# Patient Record
Sex: Female | Born: 1940 | Race: Black or African American | Hispanic: No | Smoking: Former smoker
Health system: Southern US, Community
[De-identification: ages and names within clinical notes are randomized; demographics above are authoritative.]

## PROBLEM LIST (undated history)

## (undated) DIAGNOSIS — R718 Other abnormality of red blood cells: Secondary | ICD-10-CM

## (undated) DIAGNOSIS — Z923 Personal history of irradiation: Secondary | ICD-10-CM

## (undated) DIAGNOSIS — J449 Chronic obstructive pulmonary disease, unspecified: Secondary | ICD-10-CM

## (undated) DIAGNOSIS — Z9221 Personal history of antineoplastic chemotherapy: Secondary | ICD-10-CM

## (undated) DIAGNOSIS — C801 Malignant (primary) neoplasm, unspecified: Secondary | ICD-10-CM

## (undated) DIAGNOSIS — E785 Hyperlipidemia, unspecified: Secondary | ICD-10-CM

## (undated) DIAGNOSIS — R634 Abnormal weight loss: Secondary | ICD-10-CM

## (undated) DIAGNOSIS — E039 Hypothyroidism, unspecified: Secondary | ICD-10-CM

## (undated) DIAGNOSIS — C12 Malignant neoplasm of pyriform sinus: Secondary | ICD-10-CM

## (undated) DIAGNOSIS — IMO0002 Reserved for concepts with insufficient information to code with codable children: Secondary | ICD-10-CM

## (undated) HISTORY — DX: Chronic obstructive pulmonary disease, unspecified: J44.9

## (undated) HISTORY — DX: Hyperlipidemia, unspecified: E78.5

## (undated) HISTORY — DX: Other abnormality of red blood cells: R71.8

## (undated) HISTORY — PX: HIATAL HERNIA REPAIR: SHX195

## (undated) HISTORY — DX: Hypothyroidism, unspecified: E03.9

## (undated) HISTORY — DX: Personal history of irradiation: Z92.3

## (undated) HISTORY — PX: OTHER SURGICAL HISTORY: SHX169

## (undated) HISTORY — DX: Personal history of antineoplastic chemotherapy: Z92.21

## (undated) HISTORY — DX: Malignant neoplasm of pyriform sinus: C12

## (undated) HISTORY — DX: Abnormal weight loss: R63.4

---

## 2001-10-14 ENCOUNTER — Encounter: Payer: Self-pay | Admitting: Internal Medicine

## 2001-10-14 ENCOUNTER — Ambulatory Visit (HOSPITAL_COMMUNITY): Admission: RE | Admit: 2001-10-14 | Discharge: 2001-10-14 | Payer: Self-pay | Admitting: Internal Medicine

## 2003-12-03 ENCOUNTER — Encounter: Admission: RE | Admit: 2003-12-03 | Discharge: 2003-12-03 | Payer: Self-pay | Admitting: Internal Medicine

## 2004-12-28 ENCOUNTER — Encounter: Admission: RE | Admit: 2004-12-28 | Discharge: 2004-12-28 | Payer: Self-pay | Admitting: Internal Medicine

## 2006-04-21 ENCOUNTER — Emergency Department (HOSPITAL_COMMUNITY): Admission: EM | Admit: 2006-04-21 | Discharge: 2006-04-21 | Payer: Self-pay | Admitting: Emergency Medicine

## 2006-12-11 ENCOUNTER — Encounter: Admission: RE | Admit: 2006-12-11 | Discharge: 2006-12-11 | Payer: Self-pay | Admitting: Internal Medicine

## 2008-07-05 ENCOUNTER — Encounter: Admission: RE | Admit: 2008-07-05 | Discharge: 2008-07-05 | Payer: Self-pay | Admitting: Otolaryngology

## 2008-07-07 ENCOUNTER — Ambulatory Visit (HOSPITAL_COMMUNITY): Admission: RE | Admit: 2008-07-07 | Discharge: 2008-07-08 | Payer: Self-pay | Admitting: Otolaryngology

## 2008-07-07 ENCOUNTER — Encounter (INDEPENDENT_AMBULATORY_CARE_PROVIDER_SITE_OTHER): Payer: Self-pay | Admitting: Otolaryngology

## 2008-07-07 HISTORY — PX: OTHER SURGICAL HISTORY: SHX169

## 2008-07-07 HISTORY — PX: DIRECT LARYNGOSCOPY: SHX5326

## 2008-07-15 ENCOUNTER — Ambulatory Visit (HOSPITAL_COMMUNITY): Admission: RE | Admit: 2008-07-15 | Discharge: 2008-07-15 | Payer: Self-pay | Admitting: Otolaryngology

## 2008-07-26 ENCOUNTER — Encounter: Admission: RE | Admit: 2008-07-26 | Discharge: 2008-07-26 | Payer: Self-pay | Admitting: Otolaryngology

## 2008-08-03 ENCOUNTER — Ambulatory Visit: Admission: RE | Admit: 2008-08-03 | Discharge: 2008-08-27 | Payer: Self-pay | Admitting: Radiation Oncology

## 2008-08-09 ENCOUNTER — Ambulatory Visit (HOSPITAL_COMMUNITY): Admission: RE | Admit: 2008-08-09 | Discharge: 2008-08-09 | Payer: Self-pay | Admitting: Radiation Oncology

## 2008-09-27 ENCOUNTER — Ambulatory Visit (HOSPITAL_COMMUNITY): Admission: RE | Admit: 2008-09-27 | Discharge: 2008-09-27 | Payer: Self-pay | Admitting: Surgery

## 2008-09-27 ENCOUNTER — Encounter (INDEPENDENT_AMBULATORY_CARE_PROVIDER_SITE_OTHER): Payer: Self-pay | Admitting: Surgery

## 2008-10-05 ENCOUNTER — Ambulatory Visit: Admission: RE | Admit: 2008-10-05 | Discharge: 2008-12-22 | Payer: Self-pay | Admitting: Radiation Oncology

## 2008-10-05 DIAGNOSIS — C12 Malignant neoplasm of pyriform sinus: Secondary | ICD-10-CM

## 2008-10-05 HISTORY — DX: Malignant neoplasm of pyriform sinus: C12

## 2008-10-08 ENCOUNTER — Ambulatory Visit: Payer: Self-pay | Admitting: Oncology

## 2008-10-13 ENCOUNTER — Ambulatory Visit: Payer: Self-pay | Admitting: Dentistry

## 2008-10-13 ENCOUNTER — Encounter: Admission: RE | Admit: 2008-10-13 | Discharge: 2008-10-13 | Payer: Self-pay | Admitting: Dentistry

## 2008-10-14 LAB — CBC WITH DIFFERENTIAL/PLATELET
BASO%: 0.6 % (ref 0.0–2.0)
EOS%: 0.8 % (ref 0.0–7.0)
MCH: 29.8 pg (ref 25.1–34.0)
MCHC: 34.6 g/dL (ref 31.5–36.0)
MONO#: 0.5 10*3/uL (ref 0.1–0.9)
NEUT%: 48.6 % (ref 38.4–76.8)
RBC: 4.35 10*6/uL (ref 3.70–5.45)
RDW: 13.6 % (ref 11.2–14.5)
WBC: 6.2 10*3/uL (ref 3.9–10.3)
lymph#: 2.6 10*3/uL (ref 0.9–3.3)

## 2008-10-14 LAB — PROTHROMBIN TIME: Prothrombin Time: 13.7 seconds (ref 11.6–15.2)

## 2008-10-14 LAB — COMPREHENSIVE METABOLIC PANEL
ALT: <8 U/L (ref 0–35)
AST: 13 U/L (ref 0–37)
CO2: 25 mEq/L (ref 19–32)
Calcium: 9.1 mg/dL (ref 8.4–10.5)
Chloride: 108 mEq/L (ref 96–112)
Creatinine, Ser: 0.94 mg/dL (ref 0.40–1.20)
Sodium: 145 mEq/L (ref 135–145)
Total Protein: 6.8 g/dL (ref 6.0–8.3)

## 2008-10-15 ENCOUNTER — Ambulatory Visit (HOSPITAL_COMMUNITY): Admission: RE | Admit: 2008-10-15 | Discharge: 2008-10-15 | Payer: Self-pay | Admitting: Dentistry

## 2008-10-15 ENCOUNTER — Ambulatory Visit: Payer: Self-pay | Admitting: Dentistry

## 2008-10-26 ENCOUNTER — Encounter: Payer: Self-pay | Admitting: Oncology

## 2008-10-27 ENCOUNTER — Ambulatory Visit (HOSPITAL_COMMUNITY): Admission: RE | Admit: 2008-10-27 | Discharge: 2008-10-27 | Payer: Self-pay | Admitting: Oncology

## 2008-11-18 ENCOUNTER — Ambulatory Visit: Payer: Self-pay | Admitting: Oncology

## 2008-11-22 LAB — COMPREHENSIVE METABOLIC PANEL WITH GFR
ALT: 17 U/L (ref 0–35)
AST: 31 U/L (ref 0–37)
Albumin: 3.8 g/dL (ref 3.5–5.2)
Alkaline Phosphatase: 58 U/L (ref 39–117)
BUN: 9 mg/dL (ref 6–23)
CO2: 27 meq/L (ref 19–32)
Calcium: 9.6 mg/dL (ref 8.4–10.5)
Chloride: 107 meq/L (ref 96–112)
Creatinine, Ser: 0.68 mg/dL (ref 0.40–1.20)
Glucose, Bld: 89 mg/dL (ref 70–99)
Potassium: 4.7 meq/L (ref 3.5–5.3)
Sodium: 142 meq/L (ref 135–145)
Total Bilirubin: 0.8 mg/dL (ref 0.3–1.2)
Total Protein: 7.8 g/dL (ref 6.0–8.3)

## 2008-11-22 LAB — CBC WITH DIFFERENTIAL/PLATELET
BASO%: 0.7 % (ref 0.0–2.0)
Basophils Absolute: 0 10*3/uL (ref 0.0–0.1)
EOS%: 2.3 % (ref 0.0–7.0)
Eosinophils Absolute: 0.1 10*3/uL (ref 0.0–0.5)
HCT: 36.6 % (ref 34.8–46.6)
HGB: 12.4 g/dL (ref 11.6–15.9)
LYMPH%: 40.8 % (ref 14.0–49.7)
MCH: 27.9 pg (ref 25.1–34.0)
MCHC: 33.9 g/dL (ref 31.5–36.0)
MCV: 82.2 fL (ref 79.5–101.0)
MONO#: 0.6 10*3/uL (ref 0.1–0.9)
MONO%: 10.7 % (ref 0.0–14.0)
NEUT#: 2.6 10*3/uL (ref 1.5–6.5)
NEUT%: 45.5 % (ref 38.4–76.8)
Platelets: 211 10*3/uL (ref 145–400)
RBC: 4.45 10*6/uL (ref 3.70–5.45)
RDW: 13.4 % (ref 11.2–14.5)
WBC: 5.7 10*3/uL (ref 3.9–10.3)
lymph#: 2.3 10*3/uL (ref 0.9–3.3)
nRBC: 0 % (ref 0–0)

## 2008-12-06 LAB — CBC WITH DIFFERENTIAL/PLATELET
Basophils Absolute: 0 10*3/uL (ref 0.0–0.1)
Eosinophils Absolute: 0.1 10*3/uL (ref 0.0–0.5)
HCT: 35.4 % (ref 34.8–46.6)
HGB: 11.9 g/dL (ref 11.6–15.9)
LYMPH%: 34 % (ref 14.0–49.7)
MCV: 84.2 fL (ref 79.5–101.0)
MONO%: 15.5 % — ABNORMAL HIGH (ref 0.0–14.0)
NEUT#: 1.6 10*3/uL (ref 1.5–6.5)
Platelets: 203 10*3/uL (ref 145–400)
RDW: 13.5 % (ref 11.2–14.5)

## 2008-12-06 LAB — BASIC METABOLIC PANEL
BUN: 7 mg/dL (ref 6–23)
Glucose, Bld: 106 mg/dL — ABNORMAL HIGH (ref 70–99)
Potassium: 3 mEq/L — ABNORMAL LOW (ref 3.5–5.3)

## 2008-12-13 LAB — CBC WITH DIFFERENTIAL/PLATELET
BASO%: 0.9 % (ref 0.0–2.0)
EOS%: 1.9 % (ref 0.0–7.0)
LYMPH%: 31 % (ref 14.0–49.7)
MCHC: 33.8 g/dL (ref 31.5–36.0)
MCV: 81.6 fL (ref 79.5–101.0)
MONO#: 0.7 10*3/uL (ref 0.1–0.9)
MONO%: 30.5 % — ABNORMAL HIGH (ref 0.0–14.0)
Platelets: 241 10*3/uL (ref 145–400)
RBC: 4.13 10*6/uL (ref 3.70–5.45)
WBC: 2.1 10*3/uL — ABNORMAL LOW (ref 3.9–10.3)

## 2008-12-15 ENCOUNTER — Ambulatory Visit: Payer: Self-pay | Admitting: Dentistry

## 2008-12-16 ENCOUNTER — Ambulatory Visit: Payer: Self-pay | Admitting: Oncology

## 2008-12-20 LAB — COMPREHENSIVE METABOLIC PANEL
ALT: 13 U/L (ref 0–35)
Albumin: 3.8 g/dL (ref 3.5–5.2)
CO2: 29 mEq/L (ref 19–32)
Potassium: 3.9 mEq/L (ref 3.5–5.3)
Sodium: 138 mEq/L (ref 135–145)
Total Bilirubin: 0.5 mg/dL (ref 0.3–1.2)
Total Protein: 7.8 g/dL (ref 6.0–8.3)

## 2008-12-20 LAB — CBC WITH DIFFERENTIAL/PLATELET
BASO%: 0.3 % (ref 0.0–2.0)
MCHC: 34.3 g/dL (ref 31.5–36.0)
MONO#: 0.6 10*3/uL (ref 0.1–0.9)
RBC: 4.49 10*6/uL (ref 3.70–5.45)
RDW: 14.6 % — ABNORMAL HIGH (ref 11.2–14.5)
WBC: 3.2 10*3/uL — ABNORMAL LOW (ref 3.9–10.3)
lymph#: 0.6 10*3/uL — ABNORMAL LOW (ref 0.9–3.3)

## 2008-12-20 LAB — MAGNESIUM: Magnesium: 1.9 mg/dL (ref 1.5–2.5)

## 2008-12-22 ENCOUNTER — Ambulatory Visit: Admission: RE | Admit: 2008-12-22 | Discharge: 2009-03-22 | Payer: Self-pay | Admitting: Radiation Oncology

## 2008-12-29 LAB — CBC WITH DIFFERENTIAL/PLATELET
BASO%: 0.4 % (ref 0.0–2.0)
LYMPH%: 13.1 % — ABNORMAL LOW (ref 14.0–49.7)
MCHC: 33.9 g/dL (ref 31.5–36.0)
MCV: 83.8 fL (ref 79.5–101.0)
MONO#: 0.4 10*3/uL (ref 0.1–0.9)
MONO%: 16.4 % — ABNORMAL HIGH (ref 0.0–14.0)
Platelets: 90 10*3/uL — ABNORMAL LOW (ref 145–400)
RBC: 4.71 10*6/uL (ref 3.70–5.45)
RDW: 14.6 % — ABNORMAL HIGH (ref 11.2–14.5)
WBC: 2.6 10*3/uL — ABNORMAL LOW (ref 3.9–10.3)

## 2008-12-29 LAB — BASIC METABOLIC PANEL
Calcium: 8.6 mg/dL (ref 8.4–10.5)
Sodium: 133 mEq/L — ABNORMAL LOW (ref 135–145)

## 2008-12-31 LAB — COMPREHENSIVE METABOLIC PANEL
ALT: 16 U/L (ref 0–35)
Albumin: 3.3 g/dL — ABNORMAL LOW (ref 3.5–5.2)
CO2: 31 mEq/L (ref 19–32)
Calcium: 8.1 mg/dL — ABNORMAL LOW (ref 8.4–10.5)
Chloride: 95 mEq/L — ABNORMAL LOW (ref 96–112)
Potassium: 2.2 mEq/L — CL (ref 3.5–5.3)
Sodium: 135 mEq/L (ref 135–145)
Total Protein: 6.8 g/dL (ref 6.0–8.3)

## 2008-12-31 LAB — MAGNESIUM: Magnesium: 1.2 mg/dL — ABNORMAL LOW (ref 1.5–2.5)

## 2009-01-03 LAB — BASIC METABOLIC PANEL WITH GFR
BUN: 8 mg/dL (ref 6–23)
CO2: 30 meq/L (ref 19–32)
Calcium: 8.6 mg/dL (ref 8.4–10.5)
Chloride: 97 meq/L (ref 96–112)
Creatinine, Ser: 0.85 mg/dL (ref 0.40–1.20)
Glucose, Bld: 101 mg/dL — ABNORMAL HIGH (ref 70–99)
Potassium: 2.7 meq/L — CL (ref 3.5–5.3)
Sodium: 134 meq/L — ABNORMAL LOW (ref 135–145)

## 2009-01-03 LAB — MAGNESIUM: Magnesium: 1.5 mg/dL (ref 1.5–2.5)

## 2009-01-06 ENCOUNTER — Ambulatory Visit: Payer: Self-pay | Admitting: Oncology

## 2009-01-06 LAB — COMPREHENSIVE METABOLIC PANEL
ALT: 15 U/L (ref 0–35)
BUN: 14 mg/dL (ref 6–23)
CO2: 29 mEq/L (ref 19–32)
Creatinine, Ser: 0.91 mg/dL (ref 0.40–1.20)
Total Bilirubin: 0.1 mg/dL — ABNORMAL LOW (ref 0.3–1.2)

## 2009-01-06 LAB — CBC WITH DIFFERENTIAL/PLATELET
BASO%: 0.2 % (ref 0.0–2.0)
Basophils Absolute: 0 10*3/uL (ref 0.0–0.1)
EOS%: 0.5 % (ref 0.0–7.0)
HCT: 32.2 % — ABNORMAL LOW (ref 34.8–46.6)
LYMPH%: 10.6 % — ABNORMAL LOW (ref 14.0–49.7)
MCH: 27.8 pg (ref 25.1–34.0)
MCHC: 34.2 g/dL (ref 31.5–36.0)
MONO#: 1.1 10*3/uL — ABNORMAL HIGH (ref 0.1–0.9)
NEUT%: 64.4 % (ref 38.4–76.8)
Platelets: 155 10*3/uL (ref 145–400)

## 2009-01-06 LAB — MAGNESIUM: Magnesium: 1.6 mg/dL (ref 1.5–2.5)

## 2009-01-12 LAB — CBC WITH DIFFERENTIAL/PLATELET
Basophils Absolute: 0 10*3/uL (ref 0.0–0.1)
Eosinophils Absolute: 0 10*3/uL (ref 0.0–0.5)
HCT: 34.1 % — ABNORMAL LOW (ref 34.8–46.6)
HGB: 11.9 g/dL (ref 11.6–15.9)
MCH: 28.6 pg (ref 25.1–34.0)
MONO#: 0.9 10*3/uL (ref 0.1–0.9)
NEUT#: 3 10*3/uL (ref 1.5–6.5)
NEUT%: 65.8 % (ref 38.4–76.8)
RDW: 16.2 % — ABNORMAL HIGH (ref 11.2–14.5)
WBC: 4.6 10*3/uL (ref 3.9–10.3)
lymph#: 0.6 10*3/uL — ABNORMAL LOW (ref 0.9–3.3)

## 2009-01-12 LAB — COMPREHENSIVE METABOLIC PANEL
Albumin: 4 g/dL (ref 3.5–5.2)
BUN: 18 mg/dL (ref 6–23)
CO2: 26 mEq/L (ref 19–32)
Calcium: 9.6 mg/dL (ref 8.4–10.5)
Chloride: 95 mEq/L — ABNORMAL LOW (ref 96–112)
Creatinine, Ser: 0.94 mg/dL (ref 0.40–1.20)
Glucose, Bld: 140 mg/dL — ABNORMAL HIGH (ref 70–99)
Potassium: 4.9 mEq/L (ref 3.5–5.3)

## 2009-01-12 LAB — MAGNESIUM: Magnesium: 1.9 mg/dL (ref 1.5–2.5)

## 2009-01-27 LAB — CBC WITH DIFFERENTIAL/PLATELET
Basophils Absolute: 0 10*3/uL (ref 0.0–0.1)
EOS%: 0.8 % (ref 0.0–7.0)
Eosinophils Absolute: 0 10*3/uL (ref 0.0–0.5)
HGB: 11.7 g/dL (ref 11.6–15.9)
MCV: 88 fL (ref 79.5–101.0)
MONO%: 19.9 % — ABNORMAL HIGH (ref 0.0–14.0)
NEUT#: 4.2 10*3/uL (ref 1.5–6.5)
RBC: 3.85 10*6/uL (ref 3.70–5.45)
RDW: 19.7 % — ABNORMAL HIGH (ref 11.2–14.5)
lymph#: 0.5 10*3/uL — ABNORMAL LOW (ref 0.9–3.3)

## 2009-01-27 LAB — COMPREHENSIVE METABOLIC PANEL
AST: 18 U/L (ref 0–37)
Albumin: 4 g/dL (ref 3.5–5.2)
Alkaline Phosphatase: 58 U/L (ref 39–117)
BUN: 23 mg/dL (ref 6–23)
Calcium: 9.9 mg/dL (ref 8.4–10.5)
Chloride: 97 mEq/L (ref 96–112)
Glucose, Bld: 116 mg/dL — ABNORMAL HIGH (ref 70–99)
Potassium: 5.3 mEq/L (ref 3.5–5.3)
Sodium: 135 mEq/L (ref 135–145)
Total Protein: 7.5 g/dL (ref 6.0–8.3)

## 2009-02-16 ENCOUNTER — Ambulatory Visit: Payer: Self-pay | Admitting: Oncology

## 2009-02-18 ENCOUNTER — Ambulatory Visit (HOSPITAL_COMMUNITY): Admission: RE | Admit: 2009-02-18 | Discharge: 2009-02-18 | Payer: Self-pay | Admitting: Internal Medicine

## 2009-02-18 ENCOUNTER — Encounter: Payer: Self-pay | Admitting: Oncology

## 2009-02-21 LAB — CBC WITH DIFFERENTIAL/PLATELET
BASO%: 0.1 % (ref 0.0–2.0)
EOS%: 0.9 % (ref 0.0–7.0)
HCT: 33.2 % — ABNORMAL LOW (ref 34.8–46.6)
LYMPH%: 9.2 % — ABNORMAL LOW (ref 14.0–49.7)
MCH: 31.6 pg (ref 25.1–34.0)
MCHC: 34.2 g/dL (ref 31.5–36.0)
MONO%: 12.5 % (ref 0.0–14.0)
NEUT%: 77.3 % — ABNORMAL HIGH (ref 38.4–76.8)
Platelets: 252 10*3/uL (ref 145–400)

## 2009-02-21 LAB — COMPREHENSIVE METABOLIC PANEL
ALT: 10 U/L (ref 0–35)
AST: 15 U/L (ref 0–37)
Creatinine, Ser: 0.89 mg/dL (ref 0.40–1.20)
Total Bilirubin: 0.2 mg/dL — ABNORMAL LOW (ref 0.3–1.2)

## 2009-03-09 ENCOUNTER — Ambulatory Visit: Payer: Self-pay | Admitting: Dentistry

## 2009-04-20 ENCOUNTER — Ambulatory Visit (HOSPITAL_COMMUNITY): Admission: RE | Admit: 2009-04-20 | Discharge: 2009-04-20 | Payer: Self-pay | Admitting: Oncology

## 2009-04-20 ENCOUNTER — Ambulatory Visit: Payer: Self-pay | Admitting: Oncology

## 2009-04-22 LAB — COMPREHENSIVE METABOLIC PANEL
AST: 27 U/L (ref 0–37)
Albumin: 3.8 g/dL (ref 3.5–5.2)
Alkaline Phosphatase: 46 U/L (ref 39–117)
Glucose, Bld: 103 mg/dL — ABNORMAL HIGH (ref 70–99)
Potassium: 3.9 mEq/L (ref 3.5–5.3)
Sodium: 135 mEq/L (ref 135–145)
Total Protein: 7.6 g/dL (ref 6.0–8.3)

## 2009-04-22 LAB — CBC WITH DIFFERENTIAL/PLATELET
Eosinophils Absolute: 0.1 10*3/uL (ref 0.0–0.5)
MCV: 91.3 fL (ref 79.5–101.0)
MONO#: 0.6 10*3/uL (ref 0.1–0.9)
MONO%: 14.9 % — ABNORMAL HIGH (ref 0.0–14.0)
NEUT#: 2.4 10*3/uL (ref 1.5–6.5)
RBC: 4.42 10*6/uL (ref 3.70–5.45)
RDW: 13.2 % (ref 11.2–14.5)
WBC: 3.8 10*3/uL — ABNORMAL LOW (ref 3.9–10.3)

## 2009-04-27 LAB — T4, FREE: Free T4: 0.83 ng/dL (ref 0.80–1.80)

## 2009-05-11 ENCOUNTER — Ambulatory Visit: Payer: Self-pay | Admitting: Dentistry

## 2009-06-14 ENCOUNTER — Encounter: Admission: RE | Admit: 2009-06-14 | Discharge: 2009-09-12 | Payer: Self-pay | Admitting: Radiation Oncology

## 2009-07-05 ENCOUNTER — Ambulatory Visit: Payer: Self-pay | Admitting: Dentistry

## 2009-07-06 ENCOUNTER — Ambulatory Visit (HOSPITAL_COMMUNITY): Admission: RE | Admit: 2009-07-06 | Discharge: 2009-07-06 | Payer: Self-pay | Admitting: Oncology

## 2009-07-07 ENCOUNTER — Ambulatory Visit (HOSPITAL_COMMUNITY): Admission: RE | Admit: 2009-07-07 | Discharge: 2009-07-07 | Payer: Self-pay | Admitting: Oncology

## 2009-07-18 ENCOUNTER — Ambulatory Visit: Payer: Self-pay | Admitting: Oncology

## 2009-07-21 LAB — COMPREHENSIVE METABOLIC PANEL
ALT: 14 U/L (ref 0–35)
AST: 20 U/L (ref 0–37)
Alkaline Phosphatase: 45 U/L (ref 39–117)
Total Bilirubin: 0.4 mg/dL (ref 0.3–1.2)
Total Protein: 7.2 g/dL (ref 6.0–8.3)

## 2009-07-21 LAB — CBC WITH DIFFERENTIAL/PLATELET
BASO%: 0.3 % (ref 0.0–2.0)
Basophils Absolute: 0 10*3/uL (ref 0.0–0.1)
Eosinophils Absolute: 0 10*3/uL (ref 0.0–0.5)
HCT: 38.7 % (ref 34.8–46.6)
MCH: 29.9 pg (ref 25.1–34.0)
MONO#: 0.6 10*3/uL (ref 0.1–0.9)
NEUT%: 57.1 % (ref 38.4–76.8)
Platelets: 240 10*3/uL (ref 145–400)
RDW: 13.5 % (ref 11.2–14.5)
WBC: 3.9 10*3/uL (ref 3.9–10.3)
lymph#: 1 10*3/uL (ref 0.9–3.3)

## 2009-07-30 ENCOUNTER — Emergency Department (HOSPITAL_COMMUNITY): Admission: EM | Admit: 2009-07-30 | Discharge: 2009-07-31 | Payer: Self-pay | Admitting: Emergency Medicine

## 2009-08-23 ENCOUNTER — Encounter: Admission: RE | Admit: 2009-08-23 | Discharge: 2009-08-23 | Payer: Self-pay | Admitting: Internal Medicine

## 2009-08-24 ENCOUNTER — Ambulatory Visit: Payer: Self-pay | Admitting: Dentistry

## 2009-10-12 ENCOUNTER — Ambulatory Visit: Payer: Self-pay | Admitting: Oncology

## 2009-10-14 LAB — COMPREHENSIVE METABOLIC PANEL
ALT: 8 U/L (ref 0–35)
Albumin: 3.8 g/dL (ref 3.5–5.2)
Alkaline Phosphatase: 41 U/L (ref 39–117)
Calcium: 8.5 mg/dL (ref 8.4–10.5)
Chloride: 106 mEq/L (ref 96–112)
Glucose, Bld: 88 mg/dL (ref 70–99)
Potassium: 2.9 mEq/L — ABNORMAL LOW (ref 3.5–5.3)
Total Protein: 6.6 g/dL (ref 6.0–8.3)

## 2009-10-14 LAB — CBC WITH DIFFERENTIAL/PLATELET
Basophils Absolute: 0 10*3/uL (ref 0.0–0.1)
EOS%: 2.7 % (ref 0.0–7.0)
HGB: 11.6 g/dL (ref 11.6–15.9)
LYMPH%: 30.8 % (ref 14.0–49.7)
MCH: 30.5 pg (ref 25.1–34.0)
MCHC: 34.2 g/dL (ref 31.5–36.0)
MONO#: 0.6 10*3/uL (ref 0.1–0.9)
MONO%: 14.4 % — ABNORMAL HIGH (ref 0.0–14.0)
NEUT%: 51.9 % (ref 38.4–76.8)
RDW: 13.7 % (ref 11.2–14.5)
WBC: 3.9 10*3/uL (ref 3.9–10.3)

## 2009-10-21 IMAGING — XA IR PERC PLACEMENT GASTROSTOMY
1 series · 10 of 10 positions shown · non-contrast
Comparison: none

CLINICAL HISTORY: 68-year-old with neck cancer.

[Series 1: run · 10 of 10 slices shown]
[im 1/10]
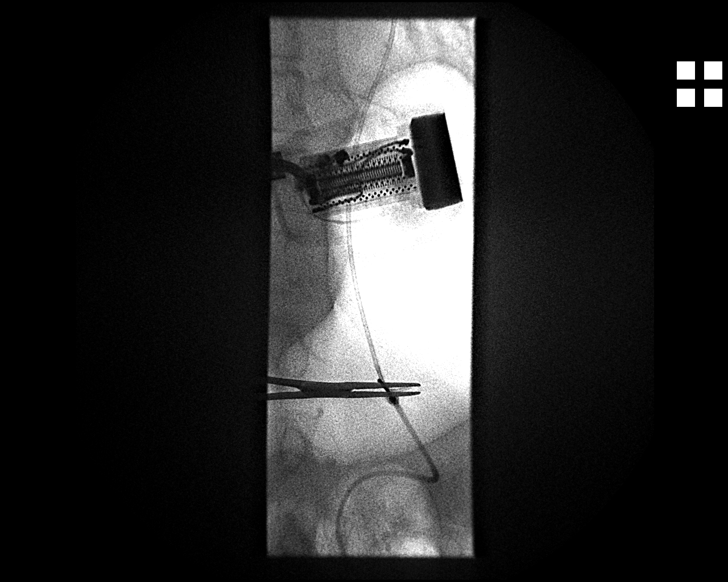
[im 2/10]
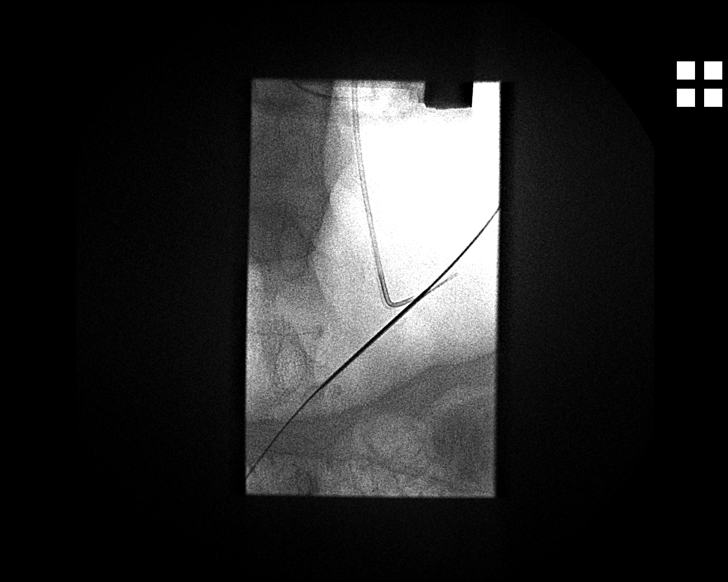
[im 3/10]
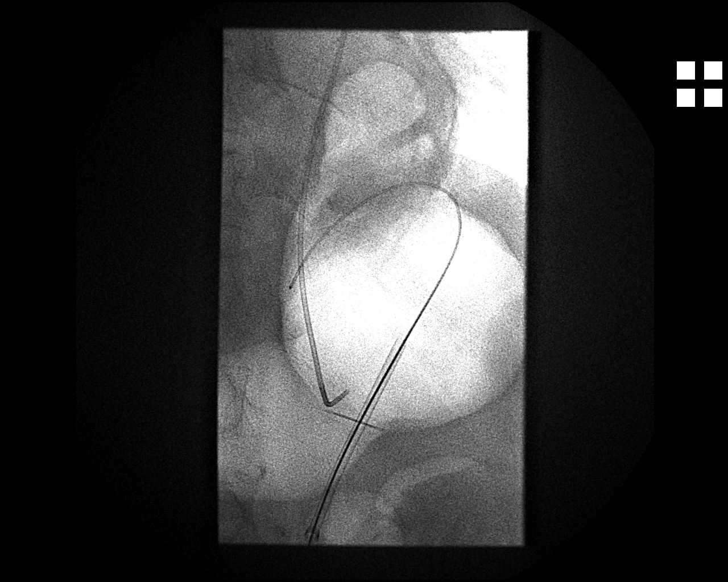
[im 4/10]
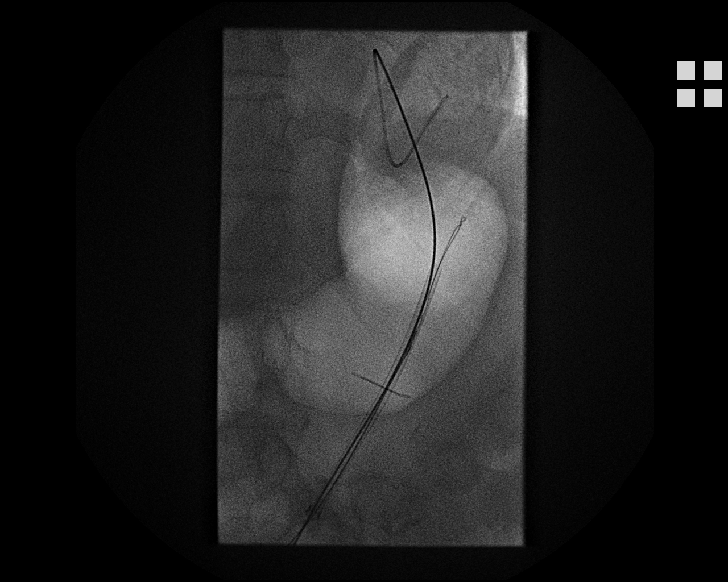
[im 5/10]
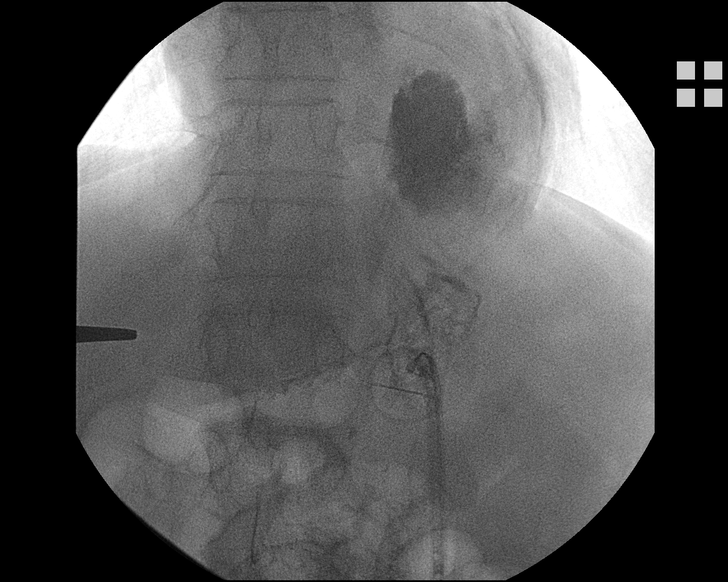
[im 6/10]
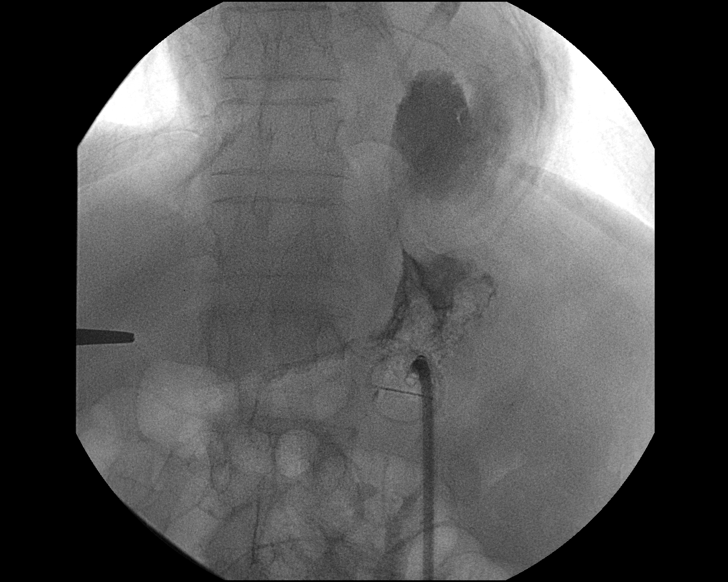
[im 7/10]
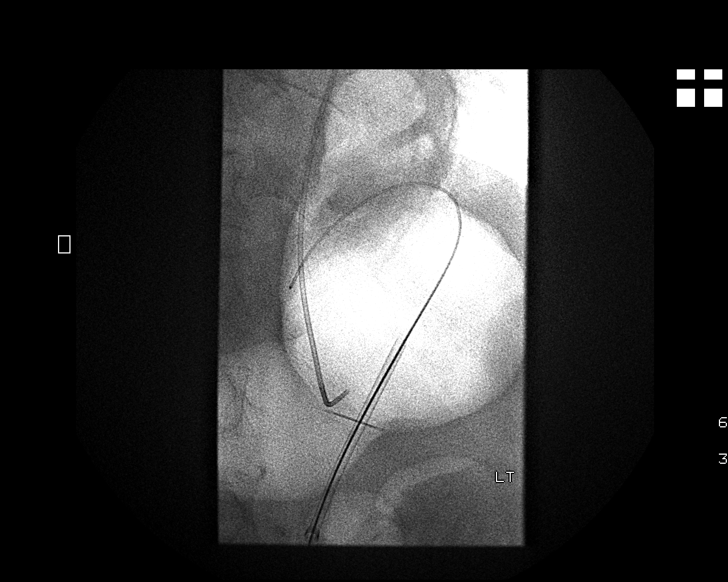
[im 8/10]
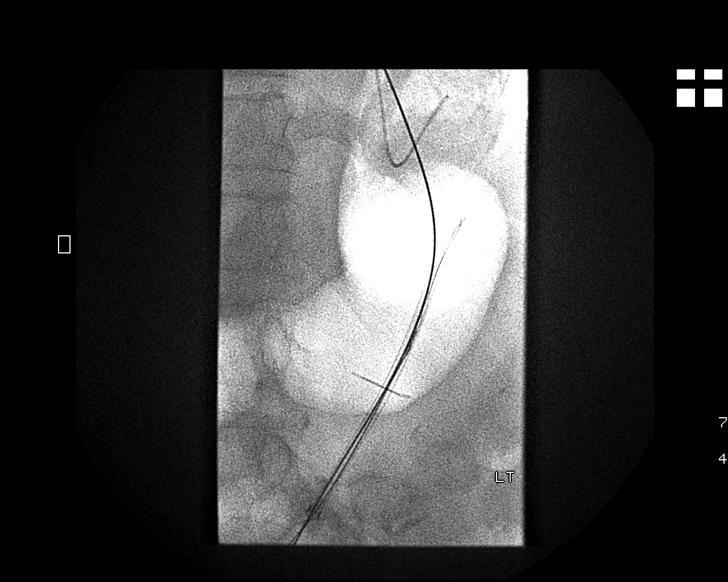
[im 9/10]
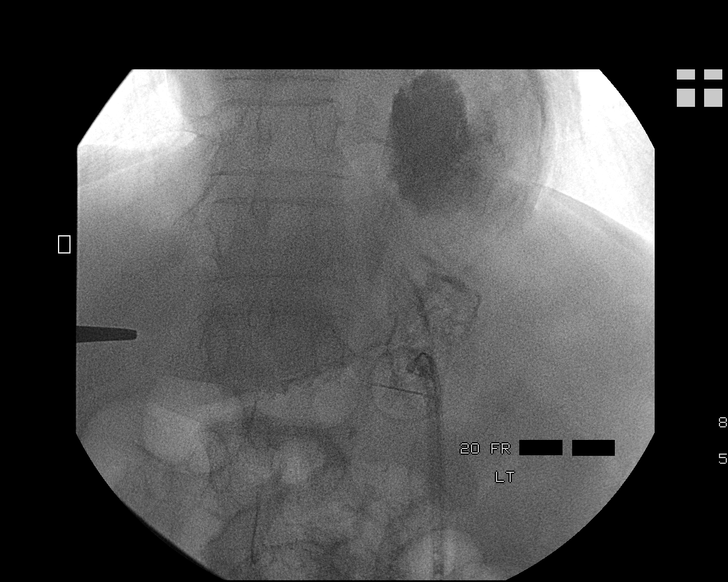
[im 10/10]
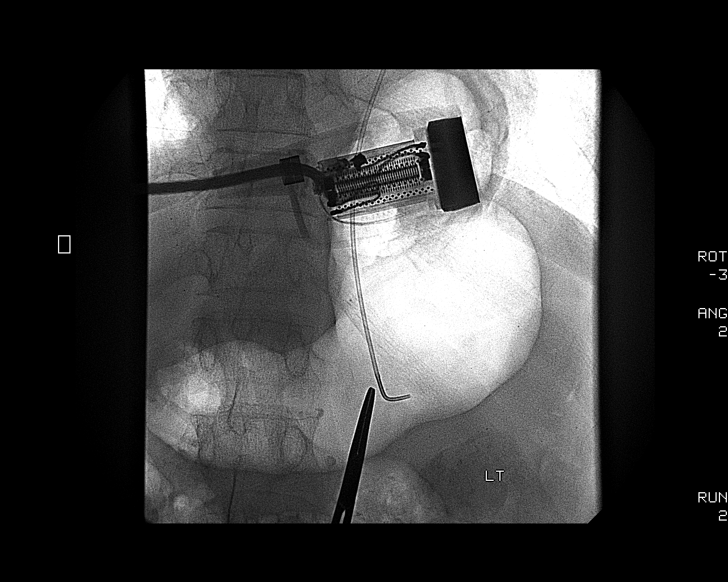

[10 of 10 positions shown; findings below may reference images not displayed]

PROCEDURE(S): PERCUTANEOUS GASTROSTOMY TUBE WITH FLUOROSCOPIC
GUIDANCE

Medications:Versed 1.5mg, Fentanyl 75 mcg vancomycin 1 gram

Sedation time:30 minutes

Fluoroscopy time: 9.3 minutes

Contrast:  25 ml [id number]

Procedure:Informed consent was obtained for a percutaneous
gastrostomy tube.  The patient was placed on the interventional
table.  Fluoroscopy demonstrated oral contrast in the transverse
colon.  An orogastric tube was placed with fluoroscopic guidance.
The anterior abdomen was prepped and draped in sterile fashion.
Stomach was inflated with air through the orogastric tube.  The
left hepatic lobe was identified with ultrasound.  The skin and
subcutaneous tissues were anesthetized with 1% lidocaine.  A 17
gauge needle was directed into the distended stomach with
fluoroscopic guidance.  A wire was advanced into the stomach and Aniwaa
Chou was deployed.  A 9-French vascular sheath was placed and the
orogastric tube was snared using a Gooseneck snare device.  The
orogastric tube and snare were pulled out of the patient's mouth.
The snare device was connected to a 20-French gastrostomy tube.
The snare device and gastrostomy tube were pulled through the
patient's mouth and out the anterior abdominal wall.  The
gastrostomy tube was cut to an appropriate length.  Contrast
injection through gastrostomy tube confirmed placement within the
stomach.  Fluoroscopic images were obtained for documentation.  The
gastrostomy tube was flushed with normal saline.
FINDINGS: Gastrostomy tube within the stomach.
IMPRESSION: Successful fluoroscopic guided percutaneous gastrostomy
tube placement.

## 2009-12-23 ENCOUNTER — Ambulatory Visit: Payer: Self-pay | Admitting: Oncology

## 2009-12-27 LAB — CBC WITH DIFFERENTIAL/PLATELET
BASO%: 0.3 % (ref 0.0–2.0)
Basophils Absolute: 0 10*3/uL (ref 0.0–0.1)
EOS%: 1.5 % (ref 0.0–7.0)
HCT: 37.4 % (ref 34.8–46.6)
HGB: 12.7 g/dL (ref 11.6–15.9)
LYMPH%: 32.3 % (ref 14.0–49.7)
MCH: 30.6 pg (ref 25.1–34.0)
MCHC: 34.1 g/dL (ref 31.5–36.0)
NEUT#: 1.8 10*3/uL (ref 1.5–6.5)
NEUT%: 49.3 % (ref 38.4–76.8)
RBC: 4.17 10*6/uL (ref 3.70–5.45)
RDW: 13.9 % (ref 11.2–14.5)

## 2009-12-27 LAB — COMPREHENSIVE METABOLIC PANEL
AST: 20 U/L (ref 0–37)
Alkaline Phosphatase: 45 U/L (ref 39–117)
BUN: 11 mg/dL (ref 6–23)
CO2: 24 mEq/L (ref 19–32)
Calcium: 9.1 mg/dL (ref 8.4–10.5)
Potassium: 3.9 mEq/L (ref 3.5–5.3)
Total Protein: 7.5 g/dL (ref 6.0–8.3)

## 2009-12-28 ENCOUNTER — Ambulatory Visit (HOSPITAL_COMMUNITY): Admission: RE | Admit: 2009-12-28 | Discharge: 2009-12-28 | Payer: Self-pay | Admitting: Oncology

## 2010-02-09 ENCOUNTER — Ambulatory Visit (HOSPITAL_COMMUNITY)
Admission: RE | Admit: 2010-02-09 | Discharge: 2010-02-09 | Payer: Self-pay | Source: Home / Self Care | Admitting: Internal Medicine

## 2010-02-28 ENCOUNTER — Ambulatory Visit: Payer: Self-pay | Admitting: Dentistry

## 2010-03-07 ENCOUNTER — Ambulatory Visit: Payer: Self-pay | Admitting: Oncology

## 2010-03-09 LAB — COMPREHENSIVE METABOLIC PANEL
BUN: 10 mg/dL (ref 6–23)
CO2: 23 mEq/L (ref 19–32)
Calcium: 8.8 mg/dL (ref 8.4–10.5)
Chloride: 104 mEq/L (ref 96–112)
Creatinine, Ser: 0.96 mg/dL (ref 0.40–1.20)
Potassium: 4.4 mEq/L (ref 3.5–5.3)
Sodium: 139 mEq/L (ref 135–145)

## 2010-03-09 LAB — CBC WITH DIFFERENTIAL/PLATELET
BASO%: 0.4 % (ref 0.0–2.0)
Eosinophils Absolute: 0 10*3/uL (ref 0.0–0.5)
HCT: 37.1 % (ref 34.8–46.6)
HGB: 12.4 g/dL (ref 11.6–15.9)
MCV: 88.6 fL (ref 79.5–101.0)
MONO#: 0.5 10*3/uL (ref 0.1–0.9)
WBC: 3.2 10*3/uL — ABNORMAL LOW (ref 3.9–10.3)

## 2010-05-04 ENCOUNTER — Ambulatory Visit
Admission: RE | Admit: 2010-05-04 | Discharge: 2010-05-04 | Payer: Self-pay | Source: Home / Self Care | Attending: Radiation Oncology | Admitting: Radiation Oncology

## 2010-05-04 LAB — TSH: TSH: 37.015 u[IU]/mL — ABNORMAL HIGH (ref 0.350–4.500)

## 2010-05-05 ENCOUNTER — Ambulatory Visit: Payer: Self-pay | Admitting: Oncology

## 2010-05-06 LAB — T4, FREE: Free T4: 0.87 ng/dL (ref 0.80–1.80)

## 2010-05-28 ENCOUNTER — Encounter: Payer: Self-pay | Admitting: Oncology

## 2010-05-28 ENCOUNTER — Encounter: Payer: Self-pay | Admitting: Internal Medicine

## 2010-07-24 IMAGING — CR DG ABDOMEN 1V
1 series · 1 of 1 positions shown · non-contrast
Comparison: 07/06/2009

CLINICAL DATA: Feeding tube replacement.

ABDOMEN - 1 VIEW

[t abdomen supine]
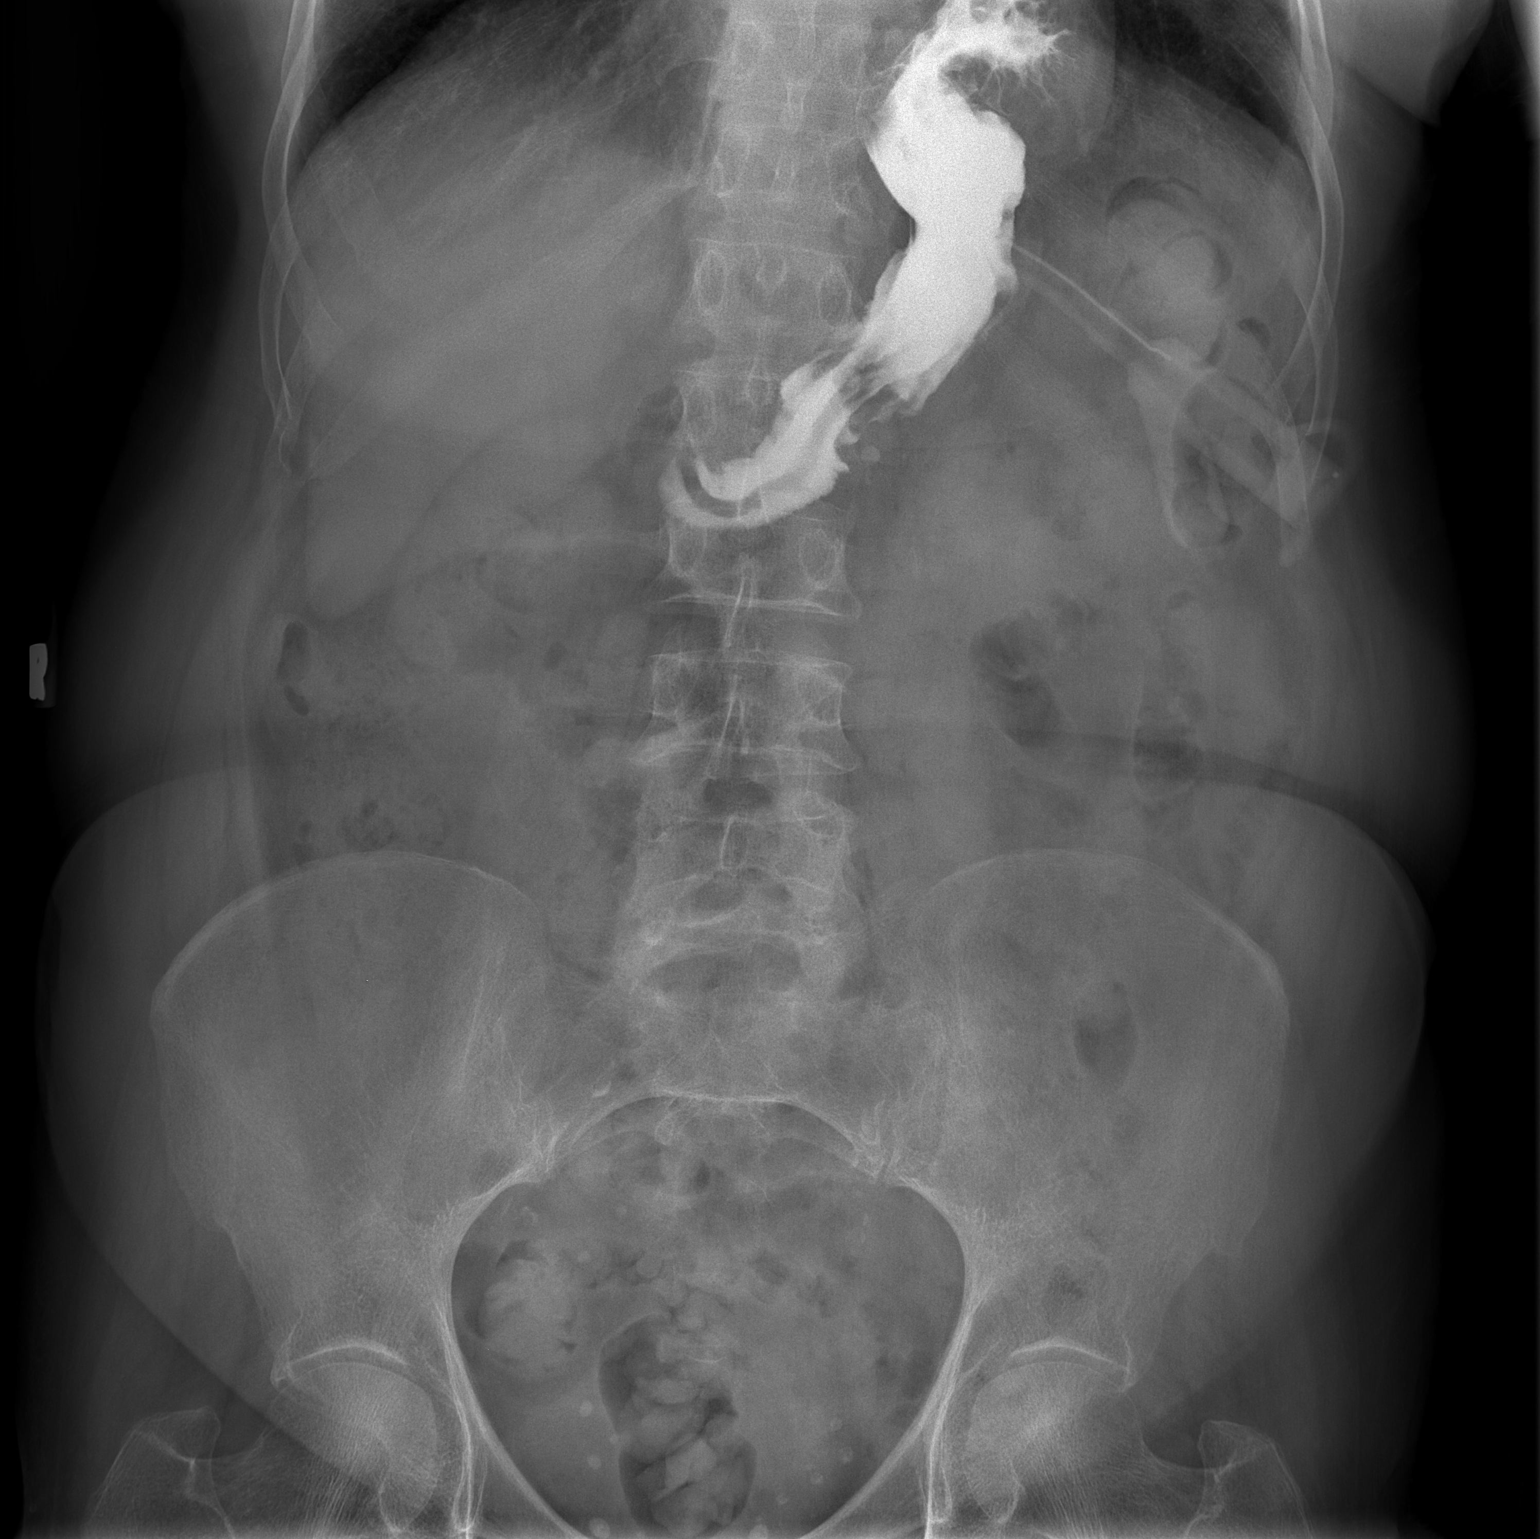

[1 of 1 positions shown; findings below may reference images not displayed]

FINDINGS: Replace gastrostomy tube was injected with contrast
material.  The tip of the catheter lies in the stomach with
contrast material present in the lumen of the stomach.  There is a
hiatal hernia.  No evidence of bowel obstruction.
IMPRESSION: Replaced catheter lies in the lumen of the stomach.

## 2010-07-31 LAB — GLUCOSE, CAPILLARY: Glucose-Capillary: 98 mg/dL (ref 70–99)

## 2010-08-08 LAB — GLUCOSE, CAPILLARY: Glucose-Capillary: 107 mg/dL — ABNORMAL HIGH (ref 70–99)

## 2010-08-14 LAB — CBC
HCT: 38 % (ref 36.0–46.0)
MCHC: 33.2 g/dL (ref 30.0–36.0)
MCV: 87.2 fL (ref 78.0–100.0)
Platelets: 213 10*3/uL (ref 150–400)
RDW: 13.4 % (ref 11.5–15.5)

## 2010-08-14 LAB — PROTIME-INR: Prothrombin Time: 14 seconds (ref 11.6–15.2)

## 2010-08-15 LAB — DIFFERENTIAL
Basophils Absolute: 0 10*3/uL (ref 0.0–0.1)
Basophils Relative: 1 % (ref 0–1)
Eosinophils Relative: 2 % (ref 0–5)
Monocytes Absolute: 0.4 10*3/uL (ref 0.1–1.0)
Neutro Abs: 2.9 10*3/uL (ref 1.7–7.7)

## 2010-08-15 LAB — BASIC METABOLIC PANEL
BUN: 7 mg/dL (ref 6–23)
CO2: 27 mEq/L (ref 19–32)
Calcium: 9.4 mg/dL (ref 8.4–10.5)
GFR calc non Af Amer: >60 mL/min (ref >60)
Glucose, Bld: 111 mg/dL — ABNORMAL HIGH (ref 70–99)
Sodium: 142 mEq/L (ref 135–145)

## 2010-08-15 LAB — CBC
Hemoglobin: 13.3 g/dL (ref 12.0–15.0)
MCHC: 33.7 g/dL (ref 30.0–36.0)
Platelets: 208 10*3/uL (ref 150–400)
RDW: 13.6 % (ref 11.5–15.5)

## 2010-08-17 LAB — CBC
Hemoglobin: 12.8 g/dL (ref 12.0–15.0)
Platelets: 212 10*3/uL (ref 150–400)
RDW: 13.3 % (ref 11.5–15.5)
WBC: 5 10*3/uL (ref 4.0–10.5)

## 2010-09-05 ENCOUNTER — Ambulatory Visit (HOSPITAL_COMMUNITY): Payer: Medicare Other | Admitting: Dentistry

## 2010-09-05 DIAGNOSIS — K117 Disturbances of salivary secretion: Secondary | ICD-10-CM

## 2010-09-05 DIAGNOSIS — K0530 Chronic periodontitis, unspecified: Secondary | ICD-10-CM

## 2010-09-05 DIAGNOSIS — K036 Deposits [accretions] on teeth: Secondary | ICD-10-CM

## 2010-09-08 ENCOUNTER — Encounter (HOSPITAL_BASED_OUTPATIENT_CLINIC_OR_DEPARTMENT_OTHER): Payer: Medicare Other | Admitting: Oncology

## 2010-09-08 ENCOUNTER — Other Ambulatory Visit: Payer: Self-pay | Admitting: Oncology

## 2010-09-08 ENCOUNTER — Other Ambulatory Visit: Payer: Self-pay | Admitting: Medical

## 2010-09-08 DIAGNOSIS — M899 Disorder of bone, unspecified: Secondary | ICD-10-CM

## 2010-09-08 DIAGNOSIS — C12 Malignant neoplasm of pyriform sinus: Secondary | ICD-10-CM

## 2010-09-08 DIAGNOSIS — M949 Disorder of cartilage, unspecified: Secondary | ICD-10-CM

## 2010-09-08 LAB — COMPREHENSIVE METABOLIC PANEL
Albumin: 3.9 g/dL (ref 3.5–5.2)
CO2: 20 mEq/L (ref 19–32)
Calcium: 8.7 mg/dL (ref 8.4–10.5)
Chloride: 107 mEq/L (ref 96–112)
Glucose, Bld: 100 mg/dL — ABNORMAL HIGH (ref 70–99)
Potassium: 3.6 mEq/L (ref 3.5–5.3)
Sodium: 140 mEq/L (ref 135–145)
Total Protein: 6.7 g/dL (ref 6.0–8.3)

## 2010-09-08 LAB — CBC WITH DIFFERENTIAL/PLATELET
Eosinophils Absolute: 0.1 10*3/uL (ref 0.0–0.5)
HGB: 11.8 g/dL (ref 11.6–15.9)
MONO#: 0.5 10*3/uL (ref 0.1–0.9)
NEUT#: 1.5 10*3/uL (ref 1.5–6.5)
RBC: 3.89 10*6/uL (ref 3.70–5.45)
RDW: 14.8 % — ABNORMAL HIGH (ref 11.2–14.5)
WBC: 3.1 10*3/uL — ABNORMAL LOW (ref 3.9–10.3)

## 2010-09-14 DIAGNOSIS — Z463 Encounter for fitting and adjustment of dental prosthetic device: Secondary | ICD-10-CM

## 2010-09-14 DIAGNOSIS — K117 Disturbances of salivary secretion: Secondary | ICD-10-CM

## 2010-09-18 ENCOUNTER — Other Ambulatory Visit: Payer: Self-pay | Admitting: Internal Medicine

## 2010-09-18 DIAGNOSIS — Z1231 Encounter for screening mammogram for malignant neoplasm of breast: Secondary | ICD-10-CM

## 2010-09-19 NOTE — Op Note (Signed)
Nancy Bates, Nancy Bates               ACCOUNT NO.:  [id number]   MEDICAL RECORD NO.:  [id number]          PATIENT TYPE:  AMB   LOCATION:  SDS                          FACILITY:  [facility]   PHYSICIAN:  Thomas A. Cornett, M.D.DATE OF BIRTH:  1940/09/28   DATE OF PROCEDURE:  09/27/2008  DATE OF DISCHARGE:  09/27/2008                               OPERATIVE REPORT   PREOPERATIVE DIAGNOSIS:  Right axillary lymphadenopathy with history of  head and neck squamous cell carcinoma.   POSTOPERATIVE DIAGNOSIS:  Right axillary lymphadenopathy with history of  head and neck squamous cell carcinoma.   PROCEDURE:  Right axillary lymph node dissection.   SURGEON:  Maisie Fus A. Cornett, MD   ASSISTANT:  Kelle Darting. Rennis Harding, NP   ESTIMATED BLOOD LOSS:  About 10 mL.   SPECIMEN:  Right axillary contents to pathology.   DRAINS:  19 Blake drain to cavity.   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  PET  scan revealed increased activity in both axilla, but a strong increase  in her right axilla.  She underwent breast examination with breast MRI,  which showed no primary breast lesion.  The radiation oncologist  requested lymph node biopsy in this area to determine if she needs  chemotherapy or radiation alone for head and neck malignancy.  I  discussed this with the patient and her daughter and felt the only way  to reliably sample the axilla was  with a right axillary lymph node  dissection.  I discussed the pros and cons and the rationale for doing  it, and this would change with therapy if these nodes were positive.  They understood the above and agreed to proceed.   DESCRIPTION OF PROCEDURE:  The patient was brought to the operating  room, placed supine.  After induction of general anesthesia, the right  axilla was prepped and draped in sterile fashion.  Incision was made  along the inferior border of her  right axillary hairline.  Dissection  was carried down to until I enter the axillary contents.  I then excised  all lymphovascular tissue between the axillary vein, the long thoracic  nerve, and the thoracodorsal trunk.  I sacrificed the intercostal  brachial nerves.  The nodes were mildly enlarged, but did not see one  that was massively enlarged.  All lymphovascular tissue was excised in  between these margins and sent to pathology for evaluation.  Hemostasis  was achieved.  Irrigation was then used and 19 Blake drain was placed in  the cavity of the previous axillary contents.  It was secured to the  skin with 2-0 nylon.  Wound was closed in layers, the deep layer  with 3-0 Vicryl and subsequent 4-0 Monocryl layer.  Dermabond was  applied.  All final counts of sponge, needle, and instruments were found  to be correct at this portion of the case.  The patient was then awoke,  taken to recovery room in satisfactory condition after extubation.      Thomas A. Cornett, M.D.  Electronically Signed     TAC/MEDQ  D:  09/27/2008  T:  09/28/2008  Job:  [id number]   cc:   Margy Clarks, MD  Antony Contras, MD

## 2010-09-19 NOTE — Op Note (Signed)
[name], [name] NO.:  [id number]   MEDICAL RECORD NO.:  [id number]          PATIENT TYPE:  AMB   LOCATION:  DAY                          FACILITY:  [facility]   PHYSICIAN:  Charlynne Pander, D.D.S.DATE OF BIRTH:  Jun 28, 1940   DATE OF PROCEDURE:  10/15/2008  DATE OF DISCHARGE:                               OPERATIVE REPORT   PREOPERATIVE DIAGNOSES:  1. 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  2. Pre chemoradiation therapy dental protocol.  3. Chronic periodontitis.  4. Dental caries.  5. Malocclusion.  6. Bilateral mandibular lingual tori.  7. Bilateral maxillary excessive tuberosities.  8. Accretions.   POSTOPERATIVE DIAGNOSES:  1. 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  2. Pre chemoradiation therapy dental protocol.  3. Chronic periodontitis.  4. Dental caries.  5. Malocclusion.  6. Bilateral mandibular lingual tori.  7. Bilateral maxillary excessive tuberosities.  8. Accretions.   OPERATIONS:  1. Extraction of teeth numbers 2, 5, 6, 13, 15, 17, 29 and 31.  2. Four quadrants of alveoloplasty.  3. Bilateral mandibular lingual tori reductions.  4. Bilateral maxillary tuberosity reductions.  5. Gross debridement of the remaining dentition.   SURGEON:  Charlynne Pander, D.D.S.   ASSISTANT:  Zettie Pho, Sales executive.   ANESTHESIA:  General anesthesia via nasoendotracheal tube.   MEDICATIONS:  1. Clindamycin 600 mg IV prior to invasive dental procedures.  2. Local anesthesia with a total utilization of six carpules each      containing 34 mg of lidocaine with 0.017 mg of epinephrine as well      as two carpules each containing 9 mg of bupivacaine with 0.009 mg      of epinephrine.   SPECIMENS:  There were eight teeth that were discarded.   DRAINS:  Were none.   CULTURES:  Were none.   COMPLICATIONS:  Were none.   ESTIMATED BLOOD LOSS:  100 mL.   FLUIDS:  Were 1200 mL of lactated Ringer solution.   INDICATIONS:  The patient recently admitted with squamous cell carcinoma  of the piriform sinus.  The patient with anticipated chemoradiation  therapy.  The patient was seen as part of a pre chemoradiation therapy  dental protocol evaluation.  The patient was examined and treatment  planned for multiple extractions with alveoloplasty and pre prosthetic  surgery as indicated along with gross debridement of the remaining  dentition.  This treatment plan was formulated to decrease the risk and  complications associated with dental infection from affecting the  patient's systemic health while on active chemoradiation therapy as well  as to prevent future complication of infection and osteoradionecrosis.   OPERATIVE FINDINGS:  The patient was examined in operating room #3.  The  teeth were identified for extraction.  The patient noted to be affected  by chronic periodontitis, accretions, mandibular tori, bilateral  maxillary excessive tuberosities, malocclusion, dental caries.  The  aforementioned necessitated movable of multiple teeth with alveoplasty,  pre prosthetic surgery as indicated, and gross debridement of the  remaining dentition.   DESCRIPTION OF PROCEDURE:  The patient was brought to the main  operating  room #3.  The patient was then placed in supine position on the  operating room table.  General anesthesia was induced via  nasoendotracheal tube.  The patient was then prepped and draped in the  usual manner for dental medicine procedure.  A time-out was performed.  The patient was identified.  Procedures were verified.  A throat pack  was placed at this time.  The oral cavity was then thoroughly examined  with the findings noted above.  The patient was then ready for the  dental medicine procedure as follows:   Local anesthesia was administered sequentially with a total utilization  of six carpules each containing 34 mg of lidocaine with 0.017 mg of  epinephrine as well as two  carpules each containing 9 mg of bupivacaine  with 0.009 mg of epinephrine.   The maxillary right and left quadrants were first approached.  The  patient was given anesthesia with lidocaine with epinephrine via  infiltration method.  The patient then was given bilateral inferior  alveolar nerve blocks utilizing the bupivacaine with epinephrine.  Further infiltration was then achieved to the mandibular areas with  lidocaine with epinephrine.   At this point in time the maxillary right quadrant was first approached.  A 15 blade incision was made from the maxillary right tuberosity and  extended to the mesial of number 8.  A surgical flap was then carefully  reflected.  Appropriate amounts of buccal and interseptal bone were  removed around teeth numbers 2, 5 and 6 with a surgical handpiece and  bur and copious amounts of sterile saline.  The teeth were then  subluxated with a series of straight elevators.  At this point in time  the coronal aspect of tooth number 2 was removed with a 53R forceps  leaving the roots remaining.  Further bone was then removed with a  surgical handpiece and bur and copious amounts of sterile saline.  The  individual roots were then removed without complication with a rongeurs.  Teeth number 5 and 6 then had the coronal aspects removed with a 150  forceps.  This left the roots remaining.  Further bone was then removed  with a surgical handpiece and bur and copious amounts of sterile saline.  The roots were then subluxated and eventually removed with a 150 forceps  without further complications.  Alveoplasty was then performed utilizing  rongeurs and bone file.  At this point in time a soft tissue fibrous  tuberosity reduction was achieved utilizing a series of 15 blade  incisions with the removal of redundant tissue.  The tissues were then  approximated and trimmed appropriately.  The surgical site was then  irrigated with copious amounts of sterile saline.   The surgical site was  then closed from the maxillary right tuberosity and extended to the  mesial of number 8 utilizing 3-0 chromic gut suture in a continuous  interrupted suture technique x1.  Two individual interrupted sutures  were then placed to further close the surgical site..   At this point in time the maxillary left quadrant was approached.  A 15  blade incision was made from the maxillary left tuberosity and extended  to the mesial of number 11.  The surgical flap was then carefully  reflected.  Appropriate amounts of buccal and interseptal bone was  removed around teeth numbers 15 and 13 appropriately with a surgical  handpiece and bur and copious amounts of sterile saline.  Tooth number  13 was  then removed with a 150 forceps without complications.  The  coronal aspect of tooth number 15 was then removed at this time leaving  the roots remaining.  The roots were then sectioned appropriately with a  surgical handpiece and bur and copious amounts of sterile saline.  The  roots were then elevated and removed with a rongeurs without further  complication.  Alveoloplasty was then performed utilizing rongeurs and  bone file.  A soft tissue fibrous tuberosity reduction was then achieved  utilizing a 15 blade and multiple incisions to remove the redundant  tissues.  The tissues were then further approximated and trimmed  appropriately.  The surgical site was then irrigated with copious  amounts of sterile saline.  The surgical site was then closed from the  maxillary tuberosity and extended to the mesial number 11 utilizing 3-0  chromic gut suture in a continuous interrupted suture technique x1.   At this point in time the mandibular quadrants were approached.  Further  infiltration was achieved with lidocaine with epinephrine as indicated.  A 15 blade incision was then made from the distal of number 17 and  extended to the mesial of number 20.  A surgical flap was then carefully   reflected.  The flap was further reflected towards the anterior on the  lingual aspect to expose the mandibular tori.  The mandibular tori were  then removed with a surgical handpiece and bur and copious amounts of  sterile saline.  Further bone was then removed around tooth number 17  with a surgical handpiece and bur and copious amounts of sterile saline.  Tooth number 17 was then removed with a 17 forceps without  complications.  Alveoplasty was then performed utilizing rongeurs and  bone file.  The surgical site was then irrigated with copious amounts of  sterile saline.  The surgical site was then closed from the distal  number 17 and extended to the distal number 20 utilizing 3-0 chromic gut  suture in continuous interrupted suture technique x1.  Two interproximal  sutures were placed between teeth numbers 20/21 and 21/22 appropriately.   At this point in time the mandibular right quadrant was approached.  A  15 blade incision was made from the distal of number 32 and extended to  the mesial of number 28.  A surgical flap was then carefully reflected.  The flap was then further reflected on the lingual aspect up into the  area of number 25 to further assist in exposing the mandibular tori on  the lingual aspect.  Surgical handpiece and bur and copious amounts of  sterile saline was utilized to remove buccal and interseptal bone around  tooth numbers 29 and 31 appropriately.  The teeth were then subluxated  with a series straight elevators.  Tooth number 29 was then removed with  a 151 forceps without complications.  The coronal aspect and mesial root  of tooth number 31 was then removed with a 23 forceps.  This left a  distal root remaining.  Further bone was then removed around the distal  root with a surgical handpiece and bur and copious amounts of sterile  saline.  The root was then elevated out with a Theatre manager.  At this  point in time alveoloplasty was then performed  utilizing rongeurs and  bone file.  The mandibular tori were then visualized and removed with a  surgical handpiece and bur and copious amounts of sterile saline.  Further alveoplasty was performed utilizing rongeurs  and bone file.  The  surgical site was then irrigated with copious amounts of sterile saline.  The tissues were approximated and trimmed appropriately.  The surgical  site was then closed from distal number 32 and extending to the distal  of number 28 utilizing 3-0 chromic gut suture in a continuous  interrupted suture technique x1.  Three interproximal sutures were  placed between teeth numbers 27 and 28, 26 and 27, 25 and 26  appropriately.   At this point in time the remaining teeth were approached.  A sonic  scaler was used to remove significant accretions.  A series of hand  curettes were then utilized to refine the removal of accretions.  The  KaVo sonic scaler was then used to again remove further accretions.  At  this point in time the entire mouth was irrigated with copious amounts  of sterile saline.  The patient was examined for complications, seeing  none, dental medicine procedure was deemed to be complete.  The throat  pack was then removed at this time.  The patient was then handed over to  the anesthesia team for final disposition.  After the proper amount of  time the patient was extubated and taken to the post anesthesia care  unit with stable vital signs and good oxygenation level.  All counts  were correct for the dental medicine procedure.  The patient will be  seen for evaluation for suture removal in approximately 10 days.  The  patient will be given pain medication as well as clindamycin oral  antibiotic therapy utilizing 150 mg every 8 hours for the next 7 days.      Charlynne Pander, D.D.S.  Electronically Signed     RFK/MEDQ  D:  10/15/2008  T:  10/15/2008  Job:  [id number]   cc:   Margy Clarks, MD  Fax: [phone]   Jethro Bolus, MD

## 2010-09-19 NOTE — Consult Note (Signed)
Nancy Bates, FRICK NO.:  [id number]   MEDICAL RECORD NO.:  [id number]          PATIENT TYPE:  out   LOCATION:                                 FACILITY:   PHYSICIAN:  Charlynne Pander, D.D.S.DATE OF BIRTH:  1940-12-05   DATE OF CONSULTATION:  10/13/2008  DATE OF DISCHARGE:                                 CONSULTATION   [name]. Son is a 70 year old female referred by Dr. Margy Clarks  for a dental consultation.  Patient with recent diagnosis of Cancer of  the piriform sinus.  Patient with anticipated chemoradiation therapy  with Drs. Ha and Brooks, respectively.  The patient is now seen as part  of a pre-chemoradiation therapy dental protocol evaluation.   MEDICAL HISTORY:  1. 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      a.     Status post direct laryngoscopy, carbon dioxide laser       debulking and biopsy with Dr. Jenne Pane on July 07, 2008.  Pathology       was positive for squamous cell carcinoma.      b.     Anticipated radiation therapy with Dr. Shon Baton.      c.     Anticipated chemotherapy with Dr. Gaylyn Rong with consultation       pending for October 14, 2008, at 2:00 p.m. with labs at 1:30.  2. Hyperlipidemia.  3. COPD.  4. Status post hiatal hernia repair.  5. Status post right ankle surgery secondary to ankle fracture.  6. Current diagnosis of osteoporosis with no current bisphosphonate      medication.   ALLERGIES:  PENICILLIN causes hives.   MEDICATIONS:  1. Spiriva 1 inhalation daily.  2. Aspirin 81 mg daily.  3. Albuterol inhalation therapy as needed.  4. Lovastatin 20 mg daily.  5. Mirtazapine 15 mg at bedtime.  6. Calcium 600 mg with vitamin D daily.  7. Hydrocodone 5/500 one to two tablets every 4 hours as needed.   SOCIAL HISTORY:  The patient is widowed with 1 daughter.  The patient  presents with Nancy Bates, who is her daughter, at this time.  Patient  with a history of smoking 1.5 packs per day  for 50 years.  The patient  did quit smoking in 2007.  Patient with a history of drinking  approximately two 40-ounce beers daily but quit in 2005.   FAMILY HISTORY:  Sister with a history of unknown cancer..   FUNCTIONAL ASSESSMENT:  The patient means independent for ADLs at this  time.   REVIEW OF SYSTEMS:  This is reviewed with the patient and daughter and  is included in the dental consultation record.   DENTAL HISTORY:   CHIEF COMPLAINT:  The patient needs a pre-radiation therapy dental  protocol evaluation.   HISTORY OF PRESENT ILLNESS:  Patient with recent diagnosis of squamous  cell carcinoma of the piriform sinus.  Patient with anticipated  chemoradiation therapy.  The patient now seen as part of pre-  chemoradiation therapy dental protocol evaluation.  The patient currently denies acute toothache, swellings or abscesses.  The patient was last seen in 2008 for several extractions and  fabrication of upper and lower acrylic partial dentures.  These were  fabricated by Dr. Hoover Browns.  The patient has not been able to wear  them recently due to ill-fitting nature by patient report.   DENTAL EXAM:  GENERAL:  The patient is a well-developed, well-nourished  female in no acute distress.  VITAL SIGNS:  Blood pressure is 121/61, pulse rate is 66, temperature is  98.6.  HEAD AND NECK EXAM:  There is no palpable lymphadenopathy.  Patient with  no acute TMJ symptoms.  INTRAORAL EXAM:  Patient with normal saliva.  The patient has bilateral  mandibular lingual tori.  The patient has bilateral excessive maxillary  tuberosities.  There is no evidence of abscess formation at this time.  DENTITION:  Patient with multiple missing teeth.  PERIODONTAL:  Patient with chronic advanced periodontal disease with  plaque and calculus accumulations, generalized gingival recession and  tooth mobility.  DENTAL CARIES:  There are multiple dental caries as per dental charting  form.   ENDODONTIC:  The patient currently denies acute pulpitis symptoms.  There is no evidence of periapical pathology at this time.  CROWN AND BRIDGE:  There are no crown or bridge restorations.  PROSTHODONTIC:  Patient with maxillary and mandibular acrylic partial  dentures.  These appear to be ill-fitting at this time.  OCCLUSION:  Patient with a poor occlusal scheme secondary to multiple  missing teeth and supereruption and drifting of the unopposed teeth into  the edentulous areas.   ASSESSMENT:  1. Chronic periodontitis with bone loss.  2. Plaque and calculus accumulations.  3. Generalized gingival recession.  4. Tooth mobility.  5. Multiple missing teeth.  6. Supereruption and drifting of the unopposed teeth into the      edentulous areas.  7. Multiple dental caries.  8. Ill-fitting maxillary and mandibular acrylic partial dentures.  9. Poor occlusal scheme.  10.Bilateral mandibular lingual tori.  11.Bilateral maxillary excessive tuberosities.  12.Area of cheek biting on the left cheek distal to tooth #17.Marland Kitchen   PLAN/RECOMMENDATION:  1. I discussed the risks, benefits and complications of various      treatment options with the patient in relationship to her medical      and dental conditions and anticipated chemoradiation therapy and      chemoradiation therapy side effects to include xerostomia,      radiation caries, trismus, mucositis, taste changes, gum and jaw      bone changes, and risk for infection, bleeding and      osteoradionecrosis.  We discussed various treatment options to      include no treatment, multiple total and subtotal extractions with      alveoloplasty, preprosthetic surgery as indicated, periodontal      therapy, dental restorations, root canal therapy, crown and bridge      therapy, implant therapy, and replacing the missing teeth as      indicated.  The patient currently wishes to proceed with extraction      of all remaining maxillary teeth as well as  tooth numbers 17, 29      and 31 with alveoloplasty and preprosthetic surgery as indicated      along with gross debridement of the remaining dentition.  This will      performed in the operating room on June 11 at 9 a.m.  Patient to  obtain a preoperative labs with Dr. Gaylyn Rong on October 14, 2008, at 1:30      p.m., and the patient will be seen as part of a pre-day surgery      evaluation on June 11 at 7:00 a.m.  2. Discussion of findings with Drs. Gaylyn Rong and [place] as indicated to      assist in future coordination of chemoradiation therapy.      Charlynne Pander, D.D.S.  Electronically Signed     RFK/MEDQ  D:  10/13/2008  T:  10/14/2008  Job:  [id number]   cc:   Jethro Bolus, MD   Margy Clarks, MD  Fax: [phone]   Antony Contras, MD  Fax: [phone]   Presurgical Testing, [facility]

## 2010-09-19 NOTE — Op Note (Signed)
NAMECHERRI, Nancy Bates               ACCOUNT NO.:  [id number]   MEDICAL RECORD NO.:  [id number]          PATIENT TYPE:  AMB   LOCATION:  SDS                          FACILITY:  [facility]   PHYSICIAN:  Antony Contras, MD     DATE OF BIRTH:  04-Jul-1940   DATE OF PROCEDURE:  07/07/2008  DATE OF DISCHARGE:                               OPERATIVE REPORT   PREOPERATIVE DIAGNOSES:  1. Throat pain.  2. Left piriform sinus mass.   POSTOPERATIVE DIAGNOSES:  1. Throat pain.  2. Left piriform sinus mass.   PROCEDURES:  1. Suspended microdirect laryngoscopy with CO2 laser biopsy and      debulking of mass.  2. Rigid esophagoscopy.   SURGEON:  Antony Contras, MD   ANESTHESIA:  General endotracheal anesthesia.   COMPLICATIONS:  None.   INDICATION:  The patient is a 70 year old African American female, who  has had throat pain on the left side for the past 3 months.  She was  found to have a mass extending over the left supraglottic larynx that on  CT imaging appears to be arising from the left piriform sinus.  She  presents to the operating room for biopsy.   FINDINGS:  The esophagus, endolarynx, and vallecula were normal.  There  was a polypoid mass extending from the left piriform sinus over the left  aryepiglottic fold.  The mass is centered in the anterior left piriform  sinus, but does not extend into the esophageal inlet.  The mass was  debulked out of the airway using the CO2 laser.   DESCRIPTION OF PROCEDURE:  The patient was identified in the holding  room and informed consent having been obtained including discussion of  risks, benefits, alternatives, the patient was brought to the operating  suite and placed on operating table in supine position.  Anesthesia was  induced.  The patient was turned 90 degrees from anesthesia under mask  ventilation.  She was easy mask ventilation.  The larynx was then  exposed using an #3 Miller blade and a 6.0 laser safe endotracheal tube  was  placed without difficulty.  The tube was taped in place and  confirmed in position by listening to the chest by anesthesia.  A rigid  esophagoscope was then lubricated and passed through the mouth and down  in the esophagus keeping the lumen in view.  It was then carefully  backed out examining the esophagus in 360 degrees coming out.  An  anterior-commissure laryngoscope was then inserted and used to view the  various areas of the pharynx and larynx.  Findings are noted above.  An  upbiting forceps was then used to take biopsies from the lateral,  anterior, and medial wall of piriform sinus more inferiorly.  The  laryngoscope was then backed out to the upper piriform sinus where the  mass was more prominent and a biopsy was taken here as well.  The  anterior-commissure laryngoscope then exchanged for a large laser  laryngoscope, which was placed in a left supraglottic position and  suspended using a Lewy arm on a Mayo  stand.  The mass was grasped with  upbiting forceps and after using epinephrine pledgets for a couple of  minutes, the CO2 laser was then used on a setting of 8 watts continuous  to make an incision along the lateral side of the aryepiglottic fold  coming anteriorly around the piriform sinus and removing the bulk of the  tumor superiorly.  Some bleeding vessel was encountered during this  process and was cauterized using long suction tip touched with the Bovie  electrocautery.  After the bulk of the tumor was removed, the area was  covered with epinephrine pledgets for another several minutes and then  removed.  The laser laryngoscope was then taken out and suspension  removed from mouth.  The anterior-commissure laryngoscope was  reinserted, used to view the area of surgery and no additional bleeding  was seen.  The throat was then suctioned as the laryngoscope was  removed.  Of note, a damp gauze was placed over the upper gum during  passage of telescopes.  During CO2  laser use, damp eye pads were taped  over the eyes and a damp towel was placed over the face.  At this point,  the patient was returned to Anesthesia for wake-up, and was extubated,  taken to the recovery room in stable condition.      Antony Contras, MD  Electronically Signed     DDB/MEDQ  D:  07/07/2008  T:  07/08/2008  Job:  [id number]

## 2010-09-22 NOTE — Procedures (Signed)
[facility]  Patient:    Nancy Bates, Nancy Bates Visit Number: [id number] MRN: [id number]          Service Type: END Location: ENDO Attending Physician:  Mervin Hack Dictated by:   Hedwig Morton. Juanda Chance, M.D. [facility] Proc. Date: 10/14/01 Admit Date:  10/14/2001 Discharge Date: 10/14/2001                             Procedure Report  PROCEDURE:  Colonoscopy.  INDICATIONS FOR PROCEDURE:  This 70 year old African-American female has a history of alcoholism, left lower quadrant abdominal pain, symptomatic hemorrhoids. She was seen recently for evaluation of left lower quadrant abdominal pain. Her stool is Hemoccult negative. There was a tenderness overlying the sigmoid colon. She is now undergoing colonoscopy to further evaluate her abdominal pain.  ENDOSCOPE:  Olympus single channel videoscope.  SEDATION:  Versed 7 mg IV, Demerol 60 mg IV.  FINDINGS:  The Olympus single channel videoscope was passed under direct vision through the rectum to the sigmoid colon. The patient was monitored by pulse oximeter. Her oxygen saturations were normal. Her prep was excellent. There were first grade hemorrhoids visualized by retroflexing the endoscope in the rectum, they did not appear to be bleeding. The sigmoid colon was normal, somewhat tortuous but there were no diverticula. The mucosa of the sigmoid and descending colon was normal. The colonoscope passed with some difficulty through the splenic flexure, transverse colon, hepatic flexure and ascending colon. The cecal pouch was reached finally after turning the patient in the left and right decubitus position. The cecal pouch was normal. The ileocecal valve was also normal. The colonoscope was then retracted and colon decompressed. No other abnormalities were found.  IMPRESSION:  First grade hemorrhoids.  PLAN:  1. Anusol-HC suppositories.  2. Metamucil one tablespoon daily.  3. High fiber diet. Dictated by:    Hedwig Morton. Juanda Chance, M.D. [facility] Attending Physician:  Mervin Hack DD:  10/14/01 TD:  10/16/01 Job: [id number]

## 2010-09-28 ENCOUNTER — Ambulatory Visit
Admission: RE | Admit: 2010-09-28 | Discharge: 2010-09-28 | Disposition: A | Discharge status: Home / Self Care | Payer: Medicare Other | Source: Ambulatory Visit | Attending: Internal Medicine | Admitting: Internal Medicine

## 2010-09-28 DIAGNOSIS — Z1231 Encounter for screening mammogram for malignant neoplasm of breast: Secondary | ICD-10-CM

## 2010-10-03 DIAGNOSIS — Z463 Encounter for fitting and adjustment of dental prosthetic device: Secondary | ICD-10-CM

## 2010-10-03 DIAGNOSIS — K117 Disturbances of salivary secretion: Secondary | ICD-10-CM

## 2010-10-12 ENCOUNTER — Encounter (HOSPITAL_COMMUNITY): Payer: Self-pay | Admitting: Dentistry

## 2010-10-12 DIAGNOSIS — K1370 Unspecified lesions of oral mucosa: Secondary | ICD-10-CM

## 2010-10-12 DIAGNOSIS — K117 Disturbances of salivary secretion: Secondary | ICD-10-CM

## 2010-10-16 DIAGNOSIS — Z463 Encounter for fitting and adjustment of dental prosthetic device: Secondary | ICD-10-CM

## 2010-10-16 DIAGNOSIS — K117 Disturbances of salivary secretion: Secondary | ICD-10-CM

## 2010-10-23 ENCOUNTER — Ambulatory Visit
Admission: RE | Admit: 2010-10-23 | Discharge: 2010-10-23 | Disposition: A | Discharge status: Home / Self Care | Payer: Medicare Other | Source: Ambulatory Visit | Attending: Radiation Oncology | Admitting: Radiation Oncology

## 2011-02-12 ENCOUNTER — Encounter: Payer: Self-pay | Admitting: Oncology

## 2011-02-12 DIAGNOSIS — C12 Malignant neoplasm of pyriform sinus: Secondary | ICD-10-CM | POA: Insufficient documentation

## 2011-02-12 DIAGNOSIS — E119 Type 2 diabetes mellitus without complications: Secondary | ICD-10-CM | POA: Insufficient documentation

## 2011-02-12 DIAGNOSIS — J449 Chronic obstructive pulmonary disease, unspecified: Secondary | ICD-10-CM | POA: Insufficient documentation

## 2011-02-12 DIAGNOSIS — E785 Hyperlipidemia, unspecified: Secondary | ICD-10-CM | POA: Insufficient documentation

## 2011-02-12 DIAGNOSIS — E039 Hypothyroidism, unspecified: Secondary | ICD-10-CM | POA: Insufficient documentation

## 2011-02-20 ENCOUNTER — Encounter: Payer: Self-pay | Admitting: *Deleted

## 2011-03-02 ENCOUNTER — Encounter: Payer: Self-pay | Admitting: *Deleted

## 2011-03-15 ENCOUNTER — Other Ambulatory Visit: Payer: Self-pay | Admitting: Lab

## 2011-03-15 ENCOUNTER — Other Ambulatory Visit (HOSPITAL_COMMUNITY): Payer: Self-pay

## 2011-03-16 ENCOUNTER — Encounter (HOSPITAL_COMMUNITY): Payer: Self-pay

## 2011-03-16 ENCOUNTER — Other Ambulatory Visit: Payer: Self-pay | Admitting: Oncology

## 2011-03-16 ENCOUNTER — Other Ambulatory Visit: Payer: Self-pay | Admitting: Lab

## 2011-03-16 ENCOUNTER — Ambulatory Visit (HOSPITAL_COMMUNITY)
Admission: RE | Admit: 2011-03-16 | Discharge: 2011-03-16 | Disposition: A | Discharge status: Home / Self Care | Payer: Medicare Other | Source: Ambulatory Visit | Attending: Oncology | Admitting: Oncology

## 2011-03-16 ENCOUNTER — Telehealth: Payer: Self-pay | Admitting: Oncology

## 2011-03-16 ENCOUNTER — Ambulatory Visit (HOSPITAL_BASED_OUTPATIENT_CLINIC_OR_DEPARTMENT_OTHER): Payer: Medicare Other | Admitting: Oncology

## 2011-03-16 ENCOUNTER — Other Ambulatory Visit (HOSPITAL_BASED_OUTPATIENT_CLINIC_OR_DEPARTMENT_OTHER): Payer: Medicare Other | Admitting: Lab

## 2011-03-16 VITALS — BP 126/71 | HR 82 | Temp 97.2°F | Ht 65.5 in | Wt 125.9 lb

## 2011-03-16 DIAGNOSIS — Z9221 Personal history of antineoplastic chemotherapy: Secondary | ICD-10-CM | POA: Insufficient documentation

## 2011-03-16 DIAGNOSIS — C12 Malignant neoplasm of pyriform sinus: Secondary | ICD-10-CM | POA: Insufficient documentation

## 2011-03-16 DIAGNOSIS — E039 Hypothyroidism, unspecified: Secondary | ICD-10-CM

## 2011-03-16 DIAGNOSIS — Z923 Personal history of irradiation: Secondary | ICD-10-CM | POA: Insufficient documentation

## 2011-03-16 DIAGNOSIS — E119 Type 2 diabetes mellitus without complications: Secondary | ICD-10-CM

## 2011-03-16 DIAGNOSIS — Z23 Encounter for immunization: Secondary | ICD-10-CM

## 2011-03-16 HISTORY — DX: Malignant (primary) neoplasm, unspecified: C80.1

## 2011-03-16 LAB — COMPREHENSIVE METABOLIC PANEL
AST: 15 U/L (ref 0–37)
Albumin: 3.5 g/dL (ref 3.5–5.2)
Alkaline Phosphatase: 39 U/L (ref 39–117)
BUN: 13 mg/dL (ref 6–23)
Potassium: 3.4 mEq/L — ABNORMAL LOW (ref 3.5–5.3)
Sodium: 139 mEq/L (ref 135–145)
Total Protein: 7.2 g/dL (ref 6.0–8.3)

## 2011-03-16 LAB — CBC WITH DIFFERENTIAL/PLATELET
BASO%: 0.3 % (ref 0.0–2.0)
EOS%: 2.6 % (ref 0.0–7.0)
MCH: 28.5 pg (ref 25.1–34.0)
MCHC: 33.9 g/dL (ref 31.5–36.0)
RDW: 14.4 % (ref 11.2–14.5)
lymph#: 1.1 10*3/uL (ref 0.9–3.3)

## 2011-03-16 MED ORDER — IOHEXOL 300 MG/ML  SOLN
100.0000 mL | Freq: Once | INTRAMUSCULAR | Status: AC | PRN
  Administered 2011-03-16: 100 mL via INTRAVENOUS

## 2011-03-16 MED ORDER — INFLUENZA VIRUS VACC SPLIT PF IM SUSP
0.5000 mL | Freq: Once | INTRAMUSCULAR | Status: AC
  Administered 2011-03-16: 0.5 mL via INTRAMUSCULAR
  Filled 2011-03-16: qty 0.5

## 2011-03-16 NOTE — Telephone Encounter (Signed)
gv pt appt for may2013 

## 2011-03-16 NOTE — Progress Notes (Signed)
[facility] OFFICE PROGRESS NOTE  CC: Nancy Bates, M.D.  Nancy Contras, MD  Nancy Bates, M.D.    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  PAST THERAPY:  Status post definitive concurrent cisplatin every 3 weeks and daily radiation therapy.  Chemotherapy concluded on 12/20/2008 and radiation therapy concluded on January 20, 2009.    CURRENT THERAPY:  watchful observation.   INTERVAL HISTORY: Nancy Bates 70 y.o. female returns for regular follow up with her daughter.  She is feeling well. She denies dysphagia and aphasia this was for an elective. She is quite active and has no fatigue. She denies headache, visual changes, nausea vomiting, chest pain, palpitation, abdominal pain, melena, hematochezia, hematuria, vaginal bleeding, bowel or bladder incontinence, low back pain, heat or cold intolerance.  MEDICAL HISTORY: Past Medical History  Diagnosis Date  . Pyriform sinus cancer 10/2008    s/p concurrent chemorad  . COPD (chronic obstructive pulmonary disease)   . Hyperlipidemia   . Hypothyroidism   . Diabetes mellitus   . Cancer     sinus and neck ca    INTERIM HISTORY: has Pyriform sinus cancer; Diabetes mellitus; Hypothyroidism; Hyperlipidemia; and COPD (chronic obstructive pulmonary disease) on her problem list.    ALLERGIES:  is allergic to penicillins.  MEDICATIONS:  Current Outpatient Prescriptions  Medication Sig Dispense Refill  . albuterol (PROVENTIL,VENTOLIN) 90 MCG/ACT inhaler Inhale 2 puffs into the lungs every 4 (four) hours as needed.        Marland Kitchen antiseptic oral rinse (BIOTENE) LIQD by Mouth Rinse route as needed.        Marland Kitchen aspirin 81 MG tablet Take 81 mg by mouth daily.        . Calcium Carbonate (CALTRATE 600 PO) Take 1 tablet by mouth daily.        . Ergocalciferol (DRISDOL PO) Take 2,000 Units by mouth daily.        Marland Kitchen levothyroxine (SYNTHROID, LEVOTHROID) 50 MCG tablet Take 50 mcg by mouth  daily.        Marland Kitchen loratadine (CLARITIN) 10 MG tablet Take 10 mg by mouth daily.        . sitaGLIPtin (JANUVIA) 100 MG tablet Take 100 mg by mouth daily.        Marland Kitchen tiotropium (SPIRIVA) 18 MCG inhalation capsule Place 18 mcg into inhaler and inhale daily.         Current Facility-Administered Medications  Medication Dose Route Frequency Provider Last Rate Last Dose  . influenza  inactive virus vaccine (FLUZONE/FLUARIX) injection 0.5 mL  0.5 mL Intramuscular Once Nancy Bolus, MD   0.5 mL at 03/16/11 1428   Facility-Administered Medications Ordered in Other Visits  Medication Dose Route Frequency Provider Last Rate Last Dose  . iohexol (OMNIPAQUE) 300 MG/ML injection 100 mL  100 mL Intravenous Once PRN Medication Radiologist   100 mL at 03/16/11 0909    REVIEW OF SYSTEMS:  Pertinent items are noted in HPI.   PHYSICAL EXAMINATION: ECOG PERFORMANCE STATUS: 0  Filed Vitals:   03/16/11 1356  BP: 126/71  Pulse: 82  Temp: 97.2 F (36.2 C)   General:  well-nourished in no acute distress.  Eyes:  no scleral icterus.  ENT:  There were no oropharyngeal lesions.  Neck was without thyromegaly.  Lymphatics:  Negative cervical, supraclavicular or axillary adenopathy.  Respiratory: lungs were clear bilaterally without wheezing or crackles.  Cardiovascular:  Regular rate and rhythm, S1/S2, without murmur,  rub or gallop.  There was no pedal edema.  GI:  abdomen was soft, flat, nontender, nondistended, without organomegaly.  Muscoloskeletal:  no spinal tenderness of palpation of vertebral spine.  Skin exam was without echymosis, petichae.  Neuro exam was nonfocal.  Patient was able to get on and off exam table without assistance.  Gait was normal.  Patient was alerted and oriented.  Attention was good.   Language was appropriate.  Mood was normal without depression.  Speech was not pressured.  Thought content was not tangential.    LABORATORY DATA: Results for orders placed in visit on 03/16/11 (from the past 48  hour(s))  COMPREHENSIVE METABOLIC PANEL     Status: Abnormal   Collection Time   03/16/11  7:55 AM      Component Value Range Comment   Sodium 139  135 - 145 (mEq/L)    Potassium 3.4 (*) 3.5 - 5.3 (mEq/L)    Chloride 104  96 - 112 (mEq/L)    CO2 27  19 - 32 (mEq/L)    Glucose, Bld 91  70 - 99 (mg/dL)    BUN 13  6 - 23 (mg/dL)    Creatinine, Ser 2.95  0.50 - 1.10 (mg/dL)    Total Bilirubin 0.3  0.3 - 1.2 (mg/dL)    Alkaline Phosphatase 39  39 - 117 (U/L)    AST 15  0 - 37 (U/L)    ALT 6  0 - 35 (U/L)    Total Protein 7.2  6.0 - 8.3 (g/dL)    Albumin 3.5  3.5 - 5.2 (g/dL)    Calcium 9.0  8.4 - 10.5 (mg/dL)     WBC 3.5; Hgb 62.1; Plt 230.  RADIOGRAPHIC STUDIES:  I personally reviewed the neck CT and showed the pictures to the patient and her daughter.   Ct Soft Tissue Neck W Contrast  03/16/2011  *RADIOLOGY REPORT*  Clinical Data: Left piriform sinus cancer.  Status post chemotherapy and radiation.  CT NECK WITH CONTRAST  Technique:  Multidetector CT imaging of the neck was performed with intravenous contrast.  Contrast: OMNIPAQUE IOHEXOL 300 MG/ML IV SOLN  Comparison: The c t neck 12/29/2010 and CT neck 07/05/2008.  Findings: Postradiation edematous changes are noted at the level of the glottis.  There is no evidence for residual or recurrent tumor. No other focal mucosal or submucosal lesions are evident.  Mild carotid wall thickening is worse on the left, the level bifurcations, also likely related to radiation.  No significant cervical adenopathy is present.  The bone windows reveal no focal lytic or blastic lesions.  No significant carotid stenosis is evident.  IMPRESSION:  1.  Postradiation changes of the neck. 2.  No acute abnormality or evidence for residual/recurrent disease.  Original Report Authenticated By: Nancy Bates, M.D.     ASSESSMENT/PLAN:    1. History of head and neck squamous cell carcinoma:  I discussed with [name] and her daughter that she has  no evidence of local disease recurrence, distal metastatic disease on clinical history, physical examination, laboratory tests, and neck CT this week.  I advised her that after 2 years of neck CT surveillance,  there's no indication to get surveillance neck CT and further as the risk of recurrence is low after 2 years.  If she has any focal symptoms of normal endoscopy exam by ENT, then we can obtain neck CT at that time. 2. Calorie-protein malnutrition.  Her weight is clinically improved and she  is on regular diet at this 3. Hypothyroidism.  She is on levothyroxine. Her TSH is normal today. No change in Synthroid dose. 4. Diabetes mellitus.  She   is on sitagliptin per PCP. 5. Primary care.  I discussed with her the pros and cons of influenza vaccination. She accepted this risk and benefit and went ahead with that today.  Her last colonoscopy was in 2010 which was negative. The next routine screening colonoscopy is in 2015. 6. Follow up:  Lab and return visit with me in 6 months. I advised to keep appointments with her ENT doctor in between visits with me as well.

## 2011-04-12 ENCOUNTER — Encounter: Payer: Self-pay | Admitting: Radiation Oncology

## 2011-04-12 ENCOUNTER — Encounter: Payer: Self-pay | Admitting: *Deleted

## 2011-04-12 ENCOUNTER — Ambulatory Visit
Admission: RE | Admit: 2011-04-12 | Discharge: 2011-04-12 | Disposition: A | Discharge status: Home / Self Care | Payer: Medicare Other | Source: Ambulatory Visit | Attending: Radiation Oncology | Admitting: Radiation Oncology

## 2011-04-12 VITALS — BP 128/68 | HR 85 | Temp 98.1°F | Resp 20 | Ht 68.0 in | Wt 127.0 lb

## 2011-04-12 DIAGNOSIS — C12 Malignant neoplasm of pyriform sinus: Secondary | ICD-10-CM

## 2011-04-12 NOTE — Progress Notes (Signed)
  Radiation Oncology         ([phone]) [phone] ________________________________  Follow up  CC: Antony Contras, MD  Exie Parody, M.D.  Candyce Churn. Allyne Gee, M.D.   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  INTERVAL SINCE LAST RADIATION:  30 months.  NARRATIVE:  Nancy Bates returns today for routine followup assessment.  She is essentially without complaint.  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  She is able to swallow foods relatively well with some modest dysphasia.  She does report xerostomia.  PHYSICAL EXAMINATION:  General:  The patient is in no acute distress.  She is alert and oriented.  Vital Signs:  Body mass index is 19.31 kg/(m^2). Filed Vitals:   04/12/11 1025  BP: 128/68  Pulse: 85  Temp: 98.1 F (36.7 C)  Resp: 20  .  Neck:  Free of any appreciable lymphadenopathy.  HEENT:  Oral cavity contains no mucosal irregularities.  The posterior oropharynx is symmetric and unremarkable.  The patient tolerated indirect mirror exam without any difficulty.  The indirect mirror showed good visualization of the posterior oropharynx and larynx.  The true vocal cords in-tented symmetrically upon phonation.  There was minimal supraglottic edema.  The pyriform sinuses appeared patent.  The entire oropharynx and larynx appeared dry with scant saliva.  Itic laryngoscopy was performed following local anesthesia. The epiglottis is free. The larynx was somewhat edematous with no mucosal irregularities. The true cords intended symmetrically upon phonation. The puriform sinuses were patent.      IMPRESSION:  Nancy Bates currently exhibits no evidence of recurrence.  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 .  Plan: The patient will return for routine followup in 6 months.   ________________________________  Artist Pais.  Kathrynn Running, M.D.

## 2011-04-12 NOTE — Progress Notes (Signed)
Very dry mouth, good appetite, denies pain.  Pt scheduled for surgery on ankle 05/10/10.

## 2011-04-19 ENCOUNTER — Ambulatory Visit: Payer: Medicare Other | Admitting: Radiation Oncology

## 2011-05-09 ENCOUNTER — Encounter: Payer: Self-pay | Admitting: Oncology

## 2011-05-09 NOTE — Progress Notes (Signed)
On call: friend [name] [phone] called as patient has had new and progressively more severe pain "whole left side of head and jaw" today, not improved with aspirin, no fever, has tried to wear dentures this week. They will call primary MD Dr.Shelton or go to urgent care or ED tonight.

## 2011-06-05 ENCOUNTER — Encounter (HOSPITAL_COMMUNITY): Payer: Self-pay | Admitting: Dentistry

## 2011-06-05 ENCOUNTER — Ambulatory Visit (HOSPITAL_COMMUNITY): Payer: Medicaid - Dental | Admitting: Dentistry

## 2011-06-05 DIAGNOSIS — K117 Disturbances of salivary secretion: Secondary | ICD-10-CM

## 2011-06-05 DIAGNOSIS — K029 Dental caries, unspecified: Secondary | ICD-10-CM

## 2011-06-05 DIAGNOSIS — K0530 Chronic periodontitis, unspecified: Secondary | ICD-10-CM

## 2011-06-05 DIAGNOSIS — K036 Deposits [accretions] on teeth: Secondary | ICD-10-CM

## 2011-06-05 MED ORDER — SODIUM FLUORIDE 1.1 % DT PSTE: PASTE | DENTAL | Status: DC

## 2011-06-05 NOTE — Progress Notes (Signed)
Tuesday, June 05, 2011  BP:  100/75            P: 93           T: 98.5  Nancy Bates is a 71 yo female who returns for periodic oral exam, adult prophy and evaluation of upper complete and lower partial dentures. Medical Hx Update: No evidence of recurrence of SCCa of Piriform Sinus per Dr. Kathrynn Running and Dr.Ha. Allergies: Penicillin Medications: See [facility].  Dental:  CC: "No problems".  Exam: Head and Neck Exam: No lymphadenopathy. No TMJ symptoms. Intraoral exam: Severe xerostomia. Atrophy of edentulous lower alveolar ridges. Dentition: Multiple missing teeth with exception of 20-28. Periodontal: Plaque accumulations significant and oral hygiene instruction was provided. Generalized gingival recession noted of all remaining teeth. Minimal mobility noted. Caries: #27 facial at gum line. Endodontic: No toothache symptoms. Prosthodontic: Upper complete and lower partial dentures are acceptable. Pressure indicating paste was applied and adjustments made as needed. [place]. Occlusion: Evaluated and adjusted as needed.    Procedures: 1. Exam 2. Adult prophy with Kavo sonic scaler. [place]. Oral hygiene instruction was provided. 3. Adjusted upper denture and lower partial denture.  4.  Rx: Prevident 5000 Dry Mouth use at bedtime as instructed. PRN refills. RTC for resin #27 and recall in 6 months. Dr. Kristin Bruins

## 2011-06-25 ENCOUNTER — Ambulatory Visit (HOSPITAL_COMMUNITY): Payer: Medicaid - Dental | Admitting: Dentistry

## 2011-06-25 DIAGNOSIS — K029 Dental caries, unspecified: Secondary | ICD-10-CM

## 2011-06-25 DIAGNOSIS — K117 Disturbances of salivary secretion: Secondary | ICD-10-CM

## 2011-06-25 NOTE — Progress Notes (Signed)
Monday, June 25, 2011   BP: 106/67          P: 64            T: 97.1  Nancy Bates presents for restorations of tooth #27 and 28. Extensive dental caries at gum line of #27 and 28 noted into root surface. Discussed attempt at excavation and possible need for Root canal therapy at later date. Patient agrees to proceed with restorations as  planned and accepts risk for carious exposure of pulp.  Procedure: 18 mg Lidocaine with epi .009 mg via Mental nerve block and infiltration at gum line of 27 and 28. Cord pack as indicated. Excavation of caries. Deep, but no exposure. Vitrabond liner placed on facial aspect of 27 and 28. Tooth #27 [id number] MFD/Resin AE, Primer, Bonding agent, Tetric Ceram EVO A3.5. Recontour and [place]. Tooth #28 [id number] MFD/Resin AER, Primer, Bonding agent, Tetric Ceram EVO A3.5. Recontour and [place]. Patient tolerated procedure well. Patient and caregiver to watch for toothache symptoms and call if these symptoms arise. Re-evaluated tooth #20,21,22. Will proceed with restoration of incipient caries at gumline. Patient can NOT afford Prevident 5000 previously prescribed at last visit. Will instead fabricate new lower fluoride tray and have patient continue use of FluoriSHIELD NAF previously prescribed. Patient dismissed to her caregiver in stable condition. RTC as scheduled.   Dr. Cindra Eves

## 2011-07-02 ENCOUNTER — Ambulatory Visit (HOSPITAL_COMMUNITY): Payer: Medicaid - Dental | Admitting: Dentistry

## 2011-07-02 VITALS — BP 110/75 | HR 81 | Temp 98.1°F

## 2011-07-02 DIAGNOSIS — K122 Cellulitis and abscess of mouth: Secondary | ICD-10-CM

## 2011-07-02 DIAGNOSIS — K117 Disturbances of salivary secretion: Secondary | ICD-10-CM

## 2011-07-02 DIAGNOSIS — K029 Dental caries, unspecified: Secondary | ICD-10-CM

## 2011-07-02 MED ORDER — CLINDAMYCIN HCL 300 MG PO CAPS: 300.0000 mg | ORAL_CAPSULE | Freq: Three times a day (TID) | ORAL | Status: DC

## 2011-07-02 MED ORDER — CHLORHEXIDINE GLUCONATE 0.12 % MT SOLN: OROMUCOSAL | Status: AC

## 2011-07-02 NOTE — Progress Notes (Signed)
Periodic Oral Examination:   07/02/2011 Nancy Bates [id number]  VITALS: BP 110/75  Pulse 81  Temp 98.1 F (36.7 C)  Nancy Bates is a 71 year old female that is S/P chemoradiation therapy for SCCa of the Piriform Sinus.  Patient had multiple resin restorations on tooth #'s 27 and 28 on 06/25/11. Patient was to proceed with dental restorations on tooth #'s 20, 21, and 22 but is complaining of swelling and discomfort of the floor of mouth since Friday, 06/29/11. Patient indicates that the condition has improved with daily use of salt water rinses but is still tender.   EXAM: The floor of mouth is erythematous and tender to palpation. The patient has moderate to severe xerostomia. I am able to palpate some saliva from the lingual salivary ducts and there is not evidence of purulence. I do not see any evidence of a stone in the salivary duct either. Orthopantogram was obtained with no evidence of periapical pathology.  ASSESSMENT: 1. Cellulitis of oral tissues most likely due to oral manipulation of tissues during restorations last week.   PLAN: 1. Clindamycin 300 mg every 8 hours for 7 days. 2. Chlorhexidine Rinse 0.12%  Rinse with 15 mls twice daily . Spit out excess. Do not swallow. Use after breakfast and at bedtime. Dispense 480 mls.  Refill X 2.  3. Consider referral to Dr. Christia Reading (ENT)  for further evaluation if this swelling persists. 4. Insert new fluoride tray and provide new prescription for FluoriSHIELD NaF 4 oz Refill one year. Use as directed in trays at bedtime. Repeat nightly.    Charlynne Pander, DDS

## 2011-07-09 ENCOUNTER — Encounter (HOSPITAL_COMMUNITY): Payer: Self-pay | Admitting: Dentistry

## 2011-07-09 ENCOUNTER — Ambulatory Visit (HOSPITAL_COMMUNITY): Payer: Medicaid - Dental | Admitting: Dentistry

## 2011-07-09 DIAGNOSIS — K117 Disturbances of salivary secretion: Secondary | ICD-10-CM

## 2011-07-09 DIAGNOSIS — K029 Dental caries, unspecified: Secondary | ICD-10-CM

## 2011-07-09 DIAGNOSIS — K1370 Unspecified lesions of oral mucosa: Secondary | ICD-10-CM

## 2011-07-09 NOTE — Progress Notes (Signed)
Monday, July 09, 2011   BP: 111/62        P:  66          T: 97.2  Nancy Bates is a 71 year old female that is S/P chemoradiation therapy for SCCa of the Piriform Sinus.  [name] presents for evaluation of recent floor of mouth swelling as well as dental restorations if patient condition allows.  Subjective:  Patient indicates that her mouth is "feeling much better". The swelling improved the evening of 07/02/11 when the antibiotics and chlorhexidine rinses were prescribed. Patient will complete her antibiotic therapy today and will continue her chlorhexidine rinses as before.  Patient has also started using her fluoride therapy in her fluoride trays as well. Patient denies having any discomfort with the teeth and had previous restorations in early February. Patient is aware that she should be watching for toothache symptoms due to the closeness of the cavities to the nerve during the restoration process. Patient wishes to proceed with dental restorations today.  EXAM: The floor of mouth is normal in appearance with no significant swelling or erythema. The patient has moderate to severe xerostomia as before. Patient with relatively good oral hygiene but is missing some plaque near the gum line and oral hygiene instruction was provided. Dental caries associated tooth numbers 20 and 22 were confirmed. Tooth #21 will be followed for future restoration.  ASSESSMENT: 1. Cellulitis of oral tissues most likely due to oral manipulation of tissues during restorations on February 18,2013-has now resolved. 2. Dental caries #20 and 22. 3. Moderate to severe xerostomia.  PLAN: 1. Clindamycin 300 mg every 8 hours until all gone. Will complete today. 2. Chlorhexidine Rinse 0.12%  Rinse with 15 mls twice daily . Spit out excess. Do not swallow. Use after breakfast and at bedtime.   3. Dental restorations #20 and 22 today.  Procedures: 18 Xylocaine with epi .009 mg via mental nerve block lower  left side. [id number] #20 Buccal/resin AE, Primer, DBA, Tetric Ceram EVO A3.5. [place]. U0454 #22 Facial/Resin AE. Primer, DBA, Tetric Ceram EVO A3.5 [place]. Patient tolerated procedure well. Patient or caregiver to call if toothache symptoms arise or is floor of mouth swelling reappears. Patient dismissed in stable condition.   Dr. Cindra Eves

## 2011-09-14 ENCOUNTER — Telehealth: Payer: Self-pay | Admitting: Oncology

## 2011-09-14 ENCOUNTER — Ambulatory Visit (HOSPITAL_BASED_OUTPATIENT_CLINIC_OR_DEPARTMENT_OTHER): Payer: Medicare Other | Admitting: Oncology

## 2011-09-14 ENCOUNTER — Other Ambulatory Visit (HOSPITAL_BASED_OUTPATIENT_CLINIC_OR_DEPARTMENT_OTHER): Payer: Medicare Other | Admitting: Lab

## 2011-09-14 VITALS — BP 122/68 | HR 67 | Temp 97.3°F | Ht 68.0 in | Wt 120.0 lb

## 2011-09-14 DIAGNOSIS — E039 Hypothyroidism, unspecified: Secondary | ICD-10-CM

## 2011-09-14 DIAGNOSIS — E119 Type 2 diabetes mellitus without complications: Secondary | ICD-10-CM

## 2011-09-14 DIAGNOSIS — C12 Malignant neoplasm of pyriform sinus: Secondary | ICD-10-CM

## 2011-09-14 LAB — CBC WITH DIFFERENTIAL/PLATELET
BASO%: 0.5 % (ref 0.0–2.0)
Basophils Absolute: 0 10*3/uL (ref 0.0–0.1)
EOS%: 3 % (ref 0.0–7.0)
HCT: 40 % (ref 34.8–46.6)
HGB: 13.1 g/dL (ref 11.6–15.9)
LYMPH%: 30.4 % (ref 14.0–49.7)
MCH: 29.1 pg (ref 25.1–34.0)
MCHC: 32.7 g/dL (ref 31.5–36.0)
MCV: 88.9 fL (ref 79.5–101.0)
NEUT%: 52 % (ref 38.4–76.8)
Platelets: 229 10*3/uL (ref 145–400)

## 2011-09-14 LAB — COMPREHENSIVE METABOLIC PANEL
Albumin: 4.3 g/dL (ref 3.5–5.2)
Alkaline Phosphatase: 40 U/L (ref 39–117)
BUN: 11 mg/dL (ref 6–23)
CO2: 28 mEq/L (ref 19–32)
Glucose, Bld: 88 mg/dL (ref 70–99)
Total Bilirubin: 0.4 mg/dL (ref 0.3–1.2)
Total Protein: 7.4 g/dL (ref 6.0–8.3)

## 2011-09-14 NOTE — Telephone Encounter (Signed)
Gv pt appt for [email or web address] °

## 2011-09-14 NOTE — Progress Notes (Signed)
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   PAST THERAPY: Status post definitive concurrent cisplatin every 3 weeks and daily radiation therapy. Chemotherapy concluded on 12/20/2008 and radiation therapy concluded on January 20, 2009.   CURRENT THERAPY: watchful observation.   INTERVAL HISTORY: Nancy Bates 71 y.o. female returns for regular follow up.  She reported feeling well except for mild xerostomia.  Her fatigue is now minimal.  She is independent of activities of daily living.  She has good appetite but has not gained much weight.  She denies dysphagia, odynophagia, neck swelling/thickening; node swelling.  She does not drink EtoH, chew tobacco, or smoke cigarettes.   Patient denies fever, headache, visual changes, confusion, drenching night sweats, palpable lymph node swelling, mucositis, odynophagia, dysphagia, nausea vomiting, jaundice, chest pain, palpitation, shortness of breath, dyspnea on exertion, productive cough, gum bleeding, epistaxis, hematemesis, hemoptysis, abdominal pain, abdominal swelling, early satiety, melena, hematochezia, hematuria, skin rash, spontaneous bleeding, joint swelling, joint pain, heat or cold intolerance, bowel bladder incontinence, back pain, focal motor weakness, paresthesia, depression, suicidal or homocidal ideation, feeling hopelessness.   Past Medical History  Diagnosis Date  . Pyriform sinus cancer 10/2008    s/p concurrent chemorad  . COPD (chronic obstructive pulmonary disease)   . Hyperlipidemia   . Hypothyroidism   . Diabetes mellitus   . Cancer     sinus and neck ca    No past surgical history on file.  Current Outpatient Prescriptions  Medication Sig Dispense Refill  . albuterol (PROVENTIL,VENTOLIN) 90 MCG/ACT inhaler Inhale 2 puffs into the lungs every 4 (four) hours as needed.          Marland Kitchen antiseptic oral rinse (BIOTENE) LIQD by Mouth Rinse route as needed.        Marland Kitchen aspirin 81 MG tablet Take 81 mg by mouth daily.        . Calcium Carbonate (CALTRATE 600 PO) Take 1 tablet by mouth daily.        . Ergocalciferol (DRISDOL PO) Take 2,000 Units by mouth daily.        Marland Kitchen levothyroxine (SYNTHROID, LEVOTHROID) 50 MCG tablet Take 50 mcg by mouth daily.        Marland Kitchen loratadine (CLARITIN) 10 MG tablet Take 10 mg by mouth daily.        Marland Kitchen lovastatin (MEVACOR) 20 MG tablet Take 20 mg by mouth daily.        . sitaGLIPtin (JANUVIA) 100 MG tablet Take 100 mg by mouth daily.        . Sodium Fluoride (PREVIDENT 5000 DRY MOUTH) 1.1 % PSTE Place a thin ribbon of toothpaste onto tooth brush. Brush teeth at bedtime for 2 minutes. Spit out excess. Do not swallow. Do not rinse with water afterwards.  106 mL  one year  . tiotropium (SPIRIVA) 18 MCG inhalation capsule Place 18 mcg into inhaler and inhale daily.          ALLERGIES:  is allergic to penicillins.  REVIEW OF SYSTEMS:  The rest of the 14-point review of system was negative.   Filed Vitals:   09/14/11 0939  BP: 122/68  Pulse: 67  Temp: 97.3 F (36.3 C)   Wt Readings from Last 3 Encounters:  09/14/11 120 lb (54.432 kg)  04/12/11 127 lb (57.607 kg)  03/16/11 125 lb 14.4 oz (57.108 kg)   ECOG Performance status:  0-1  PHYSICAL EXAMINATION:   General:  Thin-appearing woman in no acute distress.  Eyes:  no scleral icterus.  ENT:  There were no oropharyngeal lesions.  Neck was without thyromegaly.  Lymphatics:  Negative cervical, supraclavicular or axillary adenopathy.  Respiratory: lungs were clear bilaterally without wheezing or crackles.  Cardiovascular:  Regular rate and rhythm, S1/S2, without murmur, rub or gallop.  There was no pedal edema.  GI:  abdomen was soft, flat, nontender, nondistended, without organomegaly.  Muscoloskeletal:  no spinal tenderness of palpation of vertebral spine.  Skin exam was without echymosis, petichae.  Neuro  exam was nonfocal.  Patient was able to get on and off exam table without assistance.  Gait was normal.  Patient was alerted and oriented.  Attention was good.   Language was appropriate.  Mood was normal without depression.  Speech was not pressured.  Thought content was not tangential.      LABORATORY/RADIOLOGY DATA:  Lab Results  Component Value Date   WBC 3.7* 09/14/2011   HGB 13.1 09/14/2011   HCT 40.0 09/14/2011   PLT 229 09/14/2011   GLUCOSE 91 03/16/2011   ALKPHOS 39 03/16/2011   ALT 6 03/16/2011   AST 15 03/16/2011   NA 139 03/16/2011   K 3.4* 03/16/2011   CL 104 03/16/2011   CREATININE 0.89 03/16/2011   BUN 13 03/16/2011   CO2 27 03/16/2011   INR 1.1 10/27/2008      ASSESSMENT AND PLAN:   1. History of head and neck squamous cell carcinoma: she has no evidence of disease recurrence or metastatic disease.  I advised her to continue to refrain from smoking cigarettes, chewing tobacco, or drinking EtOH.  2. Calorie-protein malnutrition. Her weight is stable. Her appetite now is better than before.  3. Hypothyroidism. She is on levothyroxine. Her TSH today is pending.  4. Diabetes mellitus. She is on sitagliptin per PCP. 5. Age appropriate cancer screening: She reportedly is up to date with her mammogram.  She has had negative Pap smear per her report and does not need Pap smear anymore.   Her last colonoscopy was in 2010 which was negative. The next routine screening colonoscopy is in 2015. 6. Follow up: Lab and return visit with me in 6 months. I advised to keep appointments with her ENT and Rad Onc between visits with [place] as well.   The length of time of the face-to-face encounter was 15 minutes. More than 50% of time was spent counseling and coordination of care.

## 2011-10-10 ENCOUNTER — Encounter: Payer: Self-pay | Admitting: Radiation Oncology

## 2011-10-11 ENCOUNTER — Ambulatory Visit: Payer: Medicare Other | Admitting: Radiation Oncology

## 2011-11-20 ENCOUNTER — Encounter (HOSPITAL_COMMUNITY): Payer: Self-pay

## 2011-11-20 ENCOUNTER — Encounter (HOSPITAL_COMMUNITY)
Admission: RE | Admit: 2011-11-20 | Discharge: 2011-11-20 | Disposition: A | Discharge status: Home / Self Care | Payer: Medicare Other | Source: Ambulatory Visit | Attending: Internal Medicine | Admitting: Internal Medicine

## 2011-11-20 DIAGNOSIS — M810 Age-related osteoporosis without current pathological fracture: Secondary | ICD-10-CM | POA: Insufficient documentation

## 2011-11-20 MED ORDER — SODIUM CHLORIDE 0.9 % IV SOLN
Freq: Once | INTRAVENOUS | Status: AC
  Administered 2011-11-20: 250 mL via INTRAVENOUS

## 2011-11-20 MED ORDER — ZOLEDRONIC ACID 5 MG/100ML IV SOLN
5.0000 mg | Freq: Once | INTRAVENOUS | Status: AC
  Administered 2011-11-20: 5 mg via INTRAVENOUS
  Filled 2011-11-20: qty 100

## 2011-12-06 ENCOUNTER — Ambulatory Visit
Admission: RE | Admit: 2011-12-06 | Discharge: 2011-12-06 | Disposition: A | Discharge status: Home / Self Care | Payer: Medicare Other | Source: Ambulatory Visit | Attending: Radiation Oncology | Admitting: Radiation Oncology

## 2011-12-06 ENCOUNTER — Encounter: Payer: Self-pay | Admitting: Radiation Oncology

## 2011-12-06 VITALS — BP 116/72 | HR 61 | Temp 97.9°F | Resp 18 | Wt 121.9 lb

## 2011-12-06 DIAGNOSIS — C12 Malignant neoplasm of pyriform sinus: Secondary | ICD-10-CM

## 2011-12-06 NOTE — Progress Notes (Signed)
Patient presents to the clinic today accompanied by her daughter for a follow up appointment with Dr. Kathrynn Running. Patient is alert and oriented to person, place, and time. No distress noted. Steady gait noted. Pleasant affect noted. Patient denies pain at this time. Weight stable. Patient reports that dry mouth continues despite using Biotene. Patient denies nausea, vomiting, headache, dizziness or diarrhea. Patient denies having any ulcerations of the mouth or a sore throat. Patient denies supplementing her diet with ensure or boost. Patient reports eating three meals per day. Patient confirms she has a good appetite. Patient reports shortness of breath with exertion but, denies cough. Patient reports sleeping without difficulty. Patient denies difficulty swallowing. Patient has no complaints at this time. Vitals WDL. Reported all findings to Dr. Kathrynn Running.

## 2011-12-07 ENCOUNTER — Encounter: Payer: Self-pay | Admitting: Radiation Oncology

## 2011-12-07 NOTE — Progress Notes (Signed)
  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  She is essentially without complaint. 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 She is able to swallow foods relatively well with some modest dysphasia. She does report minimal xerostomia.                               ALLERGIES:  is allergic to penicillins.  Meds: Current Outpatient Prescriptions  Medication Sig Dispense Refill  . albuterol (PROVENTIL,VENTOLIN) 90 MCG/ACT inhaler Inhale 2 puffs into the lungs every 4 (four) hours as needed.        Marland Kitchen antiseptic oral rinse (BIOTENE) LIQD by Mouth Rinse route as needed.        Marland Kitchen aspirin 81 MG tablet Take 81 mg by mouth daily.        . Calcium Carbonate (CALTRATE 600 PO) Take 1 tablet by mouth daily.        . Ergocalciferol (DRISDOL PO) Take 2,000 Units by mouth daily.        Marland Kitchen levothyroxine (SYNTHROID, LEVOTHROID) 50 MCG tablet Take 50 mcg by mouth daily.       Marland Kitchen loratadine (CLARITIN) 10 MG tablet Take 10 mg by mouth daily.        Marland Kitchen lovastatin (MEVACOR) 20 MG tablet Take 20 mg by mouth daily.        . sitaGLIPtin (JANUVIA) 100 MG tablet Take 100 mg by mouth daily.        . Sodium Fluoride (PREVIDENT 5000 DRY MOUTH) 1.1 % PSTE Place a thin ribbon of toothpaste onto tooth brush. Brush teeth at bedtime for 2 minutes. Spit out excess. Do not swallow. Do not rinse with water afterwards.  106 mL  one year  . tiotropium (SPIRIVA) 18 MCG inhalation capsule Place 18 mcg into inhaler and inhale daily.          Physical Findings: The patient is in no acute distress. Patient is  alert and oriented.  weight is 121 lb 14.4 oz (55.293 kg). Her oral temperature is 97.9 F (36.6 C). Her blood pressure is 116/72 and her pulse is 61. Her respiration is 18. . Neck: Free of any appreciable lymphadenopathy. HEENT: Oral cavity contains no mucosal irregularities. The posterior oropharynx is symmetric and unremarkable. The patient tolerated indirect mirror exam without any difficulty. The indirect mirror showed good visualization of the posterior oropharynx and larynx. The true vocal cords in-tented symmetrically upon phonation. There was minimal supraglottic edema. The pyriform sinuses appeared patent. The entire oropharynx and larynx appeared dry with scant saliva. Indirect mirror exam was well-tolerated by the patient today.  The epiglottis is free. The larynx was somewhat edematous with no mucosal irregularities. The true cords intended symmetrically upon phonation. The puriform sinuses were patent.   No significant changes.  Impression:  The patient has no evidence of recurrence.  Plan:  Follow-up in 6 months.  _____________________________________  Artist Pais. Kathrynn Running, M.D.

## 2011-12-18 ENCOUNTER — Encounter (HOSPITAL_COMMUNITY): Payer: Self-pay | Admitting: Dentistry

## 2011-12-18 ENCOUNTER — Ambulatory Visit (HOSPITAL_COMMUNITY): Payer: Medicaid - Dental | Admitting: Dentistry

## 2011-12-18 VITALS — BP 107/61 | HR 89 | Temp 98.9°F

## 2011-12-18 DIAGNOSIS — K0530 Chronic periodontitis, unspecified: Secondary | ICD-10-CM

## 2011-12-18 DIAGNOSIS — K036 Deposits [accretions] on teeth: Secondary | ICD-10-CM

## 2011-12-18 DIAGNOSIS — K029 Dental caries, unspecified: Secondary | ICD-10-CM

## 2011-12-18 NOTE — Progress Notes (Signed)
12/18/2011  Patient:            Nancy Bates Date of Birth:  1940/10/24 MRN:                [id number]  BP 107/61  Pulse 89  Temp 98.9 F (37.2 C) (Oral)  Nancy Bates is a 71 yo female who returns for periodic oral exam, adult prophy and evaluation of upper complete and lower partial dentures.  Past Medical History  Diagnosis Date  . 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 COPD (chronic obstructive pulmonary disease)   . Hyperlipidemia   . Hypothyroidism   . Diabetes mellitus   . Cancer     sinus and neck ca  . 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 Status post chemotherapy comp. 12/20/08    Cisplatin q 3 weeks Concurrent with Radiation  Therapy   Allergies  Allergen Reactions  . Penicillins    Current Outpatient Prescriptions  Medication Sig Dispense Refill  . albuterol (PROVENTIL,VENTOLIN) 90 MCG/ACT inhaler Inhale 2 puffs into the lungs every 4 (four) hours as needed.        Marland Kitchen antiseptic oral rinse (BIOTENE) LIQD by Mouth Rinse route as needed.        Marland Kitchen aspirin 81 MG tablet Take 81 mg by mouth daily.        . Calcium Carbonate (CALTRATE 600 PO) Take 1 tablet by mouth daily.        . Ergocalciferol (DRISDOL PO) Take 2,000 Units by mouth daily.        Marland Kitchen levothyroxine (SYNTHROID, LEVOTHROID) 50 MCG tablet Take 50 mcg by mouth daily.       Marland Kitchen loratadine (CLARITIN) 10 MG tablet Take 10 mg by mouth daily.        Marland Kitchen lovastatin (MEVACOR) 20 MG tablet Take 20 mg by mouth daily.        . sitaGLIPtin (JANUVIA) 100 MG tablet Take 100 mg by mouth daily.        . Sodium Fluoride (PREVIDENT 5000 DRY MOUTH) 1.1 % PSTE Place a thin ribbon of toothpaste onto tooth brush. Brush teeth at bedtime for 2 minutes. Spit out excess. Do not swallow. Do not rinse with water afterwards.  106 mL  one year  . tiotropium (SPIRIVA) 18 MCG inhalation capsule Place 18 mcg into inhaler and inhale daily.           Dental  CC: "No problems".  Exam: Head and Neck Exam: No lymphadenopathy. No TMJ symptoms. Intraoral exam: Severe xerostomia. Atrophy of edentulous lower alveolar ridges. Dentition: Multiple missing teeth with exception of 20-28. Periodontal: Plaque accumulations significant and oral hygiene instruction was provided. Generalized gingival recession noted of all remaining teeth. Minimal mobility noted. Caries: #23 facial at gum line.  Endodontic: No toothache symptoms.Periapical radiographs shows caries but no periapical radiolucency. Prosthodontic: Upper complete and lower partial dentures are acceptable. Pressure indicating paste was applied  to the upper complete and lower cast partial dentures. Adjustments were made as needed. Dentures were polished. Occlusion: Evaluated and adjusted as needed for centric relation and protrusive strokes. She accepts results Radiographic interpretation: Periapical x-ray of #23 shows dental caries on the mesial of facial and distal facial aspect. No obvious periapical radiolucency noted.  Procedures: 1. Exam 2. Adult prophy with Kavo sonic scaler. [place]. Oral hygiene instruction was provided. 3. Adjusted upper denture and lower partial  denture.  4. Periapical radiograph #23.  RTC for resin #23 in 1-2 weeks. Recall in 6 months.  Dr. Kristin Bruins

## 2012-03-16 ENCOUNTER — Encounter (HOSPITAL_COMMUNITY): Payer: Self-pay

## 2012-03-16 ENCOUNTER — Emergency Department (HOSPITAL_COMMUNITY): Payer: Medicare Other

## 2012-03-16 ENCOUNTER — Emergency Department (HOSPITAL_COMMUNITY)
Admission: EM | Admit: 2012-03-16 | Discharge: 2012-03-16 | Disposition: A | Discharge status: Home / Self Care | Payer: Medicare Other | Attending: Emergency Medicine | Admitting: Emergency Medicine

## 2012-03-16 DIAGNOSIS — R5381 Other malaise: Secondary | ICD-10-CM | POA: Insufficient documentation

## 2012-03-16 DIAGNOSIS — Z87891 Personal history of nicotine dependence: Secondary | ICD-10-CM | POA: Insufficient documentation

## 2012-03-16 DIAGNOSIS — Z7982 Long term (current) use of aspirin: Secondary | ICD-10-CM | POA: Insufficient documentation

## 2012-03-16 DIAGNOSIS — E785 Hyperlipidemia, unspecified: Secondary | ICD-10-CM | POA: Insufficient documentation

## 2012-03-16 DIAGNOSIS — R531 Weakness: Secondary | ICD-10-CM

## 2012-03-16 DIAGNOSIS — J449 Chronic obstructive pulmonary disease, unspecified: Secondary | ICD-10-CM | POA: Insufficient documentation

## 2012-03-16 DIAGNOSIS — E039 Hypothyroidism, unspecified: Secondary | ICD-10-CM | POA: Insufficient documentation

## 2012-03-16 DIAGNOSIS — R110 Nausea: Secondary | ICD-10-CM | POA: Insufficient documentation

## 2012-03-16 DIAGNOSIS — Z85819 Personal history of malignant neoplasm of unspecified site of lip, oral cavity, and pharynx: Secondary | ICD-10-CM | POA: Insufficient documentation

## 2012-03-16 DIAGNOSIS — J4489 Other specified chronic obstructive pulmonary disease: Secondary | ICD-10-CM | POA: Insufficient documentation

## 2012-03-16 DIAGNOSIS — E119 Type 2 diabetes mellitus without complications: Secondary | ICD-10-CM | POA: Insufficient documentation

## 2012-03-16 DIAGNOSIS — Z79899 Other long term (current) drug therapy: Secondary | ICD-10-CM | POA: Insufficient documentation

## 2012-03-16 DIAGNOSIS — Z8589 Personal history of malignant neoplasm of other organs and systems: Secondary | ICD-10-CM | POA: Insufficient documentation

## 2012-03-16 DIAGNOSIS — R5383 Other fatigue: Secondary | ICD-10-CM | POA: Insufficient documentation

## 2012-03-16 LAB — CBC WITH DIFFERENTIAL/PLATELET
Basophils Absolute: 0 10*3/uL (ref 0.0–0.1)
Eosinophils Absolute: 0.2 10*3/uL (ref 0.0–0.7)
Eosinophils Relative: 4 % (ref 0–5)
MCH: 28 pg (ref 26.0–34.0)
MCV: 83.7 fL (ref 78.0–100.0)
Monocytes Absolute: 0.6 10*3/uL (ref 0.1–1.0)
Platelets: 208 10*3/uL (ref 150–400)
RDW: 13.9 % (ref 11.5–15.5)

## 2012-03-16 LAB — COMPREHENSIVE METABOLIC PANEL
ALT: 5 U/L (ref 0–35)
AST: 14 U/L (ref 0–37)
Calcium: 9 mg/dL (ref 8.4–10.5)
Creatinine, Ser: 0.81 mg/dL (ref 0.50–1.10)
GFR calc Af Amer: 82 mL/min — ABNORMAL LOW (ref >90)
Glucose, Bld: 88 mg/dL (ref 70–99)
Sodium: 140 mEq/L (ref 135–145)
Total Protein: 7.2 g/dL (ref 6.0–8.3)

## 2012-03-16 LAB — URINALYSIS, ROUTINE W REFLEX MICROSCOPIC
Hgb urine dipstick: NEGATIVE
Nitrite: NEGATIVE
Protein, ur: NEGATIVE mg/dL
Specific Gravity, Urine: 1.019 (ref 1.005–1.030)
Urobilinogen, UA: 0.2 mg/dL (ref 0.0–1.0)

## 2012-03-16 LAB — POCT I-STAT TROPONIN I: Troponin i, poc: 0.02 ng/mL (ref 0.00–0.08)

## 2012-03-16 NOTE — ED Provider Notes (Signed)
Medical screening examination/treatment/procedure(s) were conducted as a shared visit with non-physician practitioner(s) and myself.  I personally evaluated the patient during the encounter On my exam this pleasant elderly female was in no distress.  Discussed all findings.  She was discharged in stable condition, after a largely unremarkable evaluation.  A urine culture was pending and the patient will follow up with primary care physician in 2 days.  Gerhard Munch, MD 03/16/12 [phone]

## 2012-03-16 NOTE — ED Notes (Signed)
Pt. Is having general weakness, nausea, headache.  Symptoms began yesterday.  Pt. Is alert and oriented X3,  Answers questions appropriately.   Speech is clear.   Pt. Has a hx of throat cancer.  She also is dizzy which began yesterday.

## 2012-03-16 NOTE — Patient Instructions (Addendum)
1.  History of head and neck cancer.  2.  Fatigue:  Pending thyroid test.  If there is focal weakness or concern, we may consider brain CT to rule out metastatic disease.

## 2012-03-16 NOTE — ED Provider Notes (Signed)
History     CSN: [id number]  Arrival date & time 03/16/12  1191   First MD Initiated Contact with Patient 03/16/12 0801      Chief Complaint  Patient presents with  . Weakness    (Consider location/radiation/quality/duration/timing/severity/associated sxs/prior treatment) HPI Comments: Patient with history of head and neck cancer, diabetes, high cholesterol -- presents today with generalized weakness, nausea since yesterday. Onset of symptoms were gradual. Family member at the bedside said that the patient started complaining yesterday of feeling bad. This morning, the patient was unable to get up to a standing position from a chair because she felt so weak. She was brought to the hospital for evaluation. Patient currently says that her head feels like it is 'weak and numb'. She also states that her stomach feels nauseous and numb. She denies chest pain, shortness of breath, urinary symptoms. Patient was treated for her cancer with chemotherapy and radiation and was completed in 2010. Course is constant. Nothing makes symptoms better or worse. No treatments prior to arrival.  Patient is a 71 y.o. female presenting with weakness. The history is provided by the patient, medical records and a relative.  Weakness The primary symptoms include nausea. Primary symptoms do not include headaches, dizziness, fever or vomiting.  Additional symptoms include weakness.    Past Medical History  Diagnosis Date  . 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 COPD (chronic obstructive pulmonary disease)   . Hyperlipidemia   . Hypothyroidism   . Diabetes mellitus   . Cancer     sinus and neck ca  . 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 Status post chemotherapy comp. 12/20/08    Cisplatin q 3 weeks Concurrent with Radiation  Therapy    Past Surgical History  Procedure Date  . Hiatal hernia repair   . Right  ankle     S/p Fracture  . Direct laryngoscopy 07/07/08  . Debulking 07/07/08    Laser Debulking during direct Laryngoscopy  - Squmaous cell of the Pyriform Sinus     Family History  Problem Relation Age of Onset  . Stroke Mother   . Hypertension Mother   . Cancer Sister     stomach  . Diabetes Sister     History  Substance Use Topics  . Smoking status: Former Smoker -- 3.0 packs/day for 50 years    Types: Cigarettes    Quit date: 04/11/2001  . Smokeless tobacco: Never Used  . Alcohol Use: Not on file     Comment: hx of alcohol-quit 2005    OB History    Grav Para Term Preterm Abortions TAB SAB Ect Mult Living                  Review of Systems  Constitutional: Negative for fever.  HENT: Negative for sore throat and rhinorrhea.   Eyes: Negative for redness.  Respiratory: Negative for cough.   Cardiovascular: Negative for chest pain.  Gastrointestinal: Positive for nausea. Negative for vomiting, abdominal pain and diarrhea.  Genitourinary: Negative for dysuria.  Musculoskeletal: Negative for myalgias.  Skin: Negative for rash.  Neurological: Positive for weakness. Negative for dizziness, syncope, facial asymmetry, speech difficulty and headaches.    Allergies  Penicillins  Home Medications   Current Outpatient Rx  Name  Route  Sig  Dispense  Refill  . ALBUTEROL 90 MCG/ACT IN AERS   Inhalation  Inhale 2 puffs into the lungs every 4 (four) hours as needed.           Marland Kitchen BIOTENE DRY MOUTH MT LIQD   Mouth Rinse   by Mouth Rinse route as needed.           . ASPIRIN 81 MG PO TABS   Oral   Take 81 mg by mouth daily.           Marland Kitchen CALTRATE 600 PO   Oral   Take 1 tablet by mouth daily.           . DRISDOL PO   Oral   Take 2,000 Units by mouth daily.           Marland Kitchen LEVOTHYROXINE SODIUM 50 MCG PO TABS   Oral   Take 50 mcg by mouth daily.          Marland Kitchen LORATADINE 10 MG PO TABS   Oral   Take 10 mg by mouth daily.           Marland Kitchen LOVASTATIN 20 MG PO TABS    Oral   Take 20 mg by mouth daily.           Marland Kitchen SITAGLIPTIN PHOSPHATE 100 MG PO TABS   Oral   Take 100 mg by mouth daily.           . SODIUM FLUORIDE 1.1 % DT PSTE      Place a thin ribbon of toothpaste onto tooth brush. Brush teeth at bedtime for 2 minutes. Spit out excess. Do not swallow. Do not rinse with water afterwards.   106 mL   one year   . TIOTROPIUM BROMIDE MONOHYDRATE 18 MCG IN CAPS   Inhalation   Place 18 mcg into inhaler and inhale daily.             BP 122/73  Temp 98.7 F (37.1 C) (Oral)  Resp 14  Ht 5\' 8"  (1.727 m)  Wt 122 lb (55.339 kg)  BMI 18.55 kg/m2  SpO2 98%  Physical Exam  Nursing note and vitals reviewed. Constitutional: She is oriented to person, place, and time. She appears well-developed and well-nourished.  HENT:  Head: Normocephalic and atraumatic.  Right Ear: Tympanic membrane, external ear and ear canal normal.  Left Ear: Tympanic membrane, external ear and ear canal normal.  Nose: Nose normal.  Mouth/Throat: Uvula is midline and oropharynx is clear and moist. Mucous membranes are dry.  Eyes: Conjunctivae normal, EOM and lids are normal. Pupils are equal, round, and reactive to light. Right eye exhibits no discharge. Left eye exhibits no discharge. Right eye exhibits no nystagmus. Left eye exhibits no nystagmus.  Neck: Normal range of motion. Neck supple.  Cardiovascular: Normal rate, regular rhythm and normal heart sounds.   No murmur heard. Pulmonary/Chest: Effort normal and breath sounds normal. No respiratory distress. She has no wheezes. She has no rales.  Abdominal: Soft. Bowel sounds are normal. She exhibits no distension. There is no tenderness. There is no rebound and no guarding.  Musculoskeletal: She exhibits no edema and no tenderness.       Cervical back: She exhibits normal range of motion, no tenderness and no bony tenderness.  Neurological: She is alert and oriented to person, place, and time. She has normal strength  and normal reflexes. No cranial nerve deficit or sensory deficit. She displays a negative Romberg sign. Coordination and gait normal. GCS eye subscore is 4. GCS verbal subscore is 5. GCS motor subscore  is 6.  Skin: Skin is warm and dry.  Psychiatric: She has a normal mood and affect.    ED Course  Procedures (including critical care time)  Labs Reviewed  CBC WITH DIFFERENTIAL - Abnormal; Notable for the following:    HCT 35.9 (*)     All other components within normal limits  COMPREHENSIVE METABOLIC PANEL - Abnormal; Notable for the following:    Albumin 3.4 (*)     GFR calc non Af Amer 70 (*)     GFR calc Af Amer 82 (*)     All other components within normal limits  URINALYSIS, ROUTINE W REFLEX MICROSCOPIC - Abnormal; Notable for the following:    Leukocytes, UA SMALL (*)     All other components within normal limits  LACTIC ACID, PLASMA  POCT I-STAT TROPONIN I  URINE MICROSCOPIC-ADD ON  URINE CULTURE   Dg Chest 2 View  03/16/2012  *RADIOLOGY REPORT*  Clinical Data: Shortness of breath for 1 day.  History diabetes, weakness, dizziness.  CHEST - 2 VIEW  Comparison: Chest x-ray 09/22/2008  Findings: The lungs are hyperinflated.  The heart size is normal. Hiatal hernia is present.  There are no focal consolidations or pleural effusions.  Calcified mediastinal and hilar lymph nodes are identified.  Calcified areas of scarring are identified in the right upper lobe, stable in appearance.  No edema. Visualized osseous structures have a normal appearance.  Surgical clips are identified in the right axilla.  IMPRESSION:  1.  Hyperinflation. 2. No evidence for acute cardiopulmonary abnormality.   Original Report Authenticated By: Norva Pavlov, M.D.      1. Weakness     8:22 AM Patient seen and examined. Work-up initiated. D/w Dr. Jeraldine Loots who will see.   Vital signs reviewed and are as follows: Filed Vitals:   03/16/12 0815  BP: 122/73  Temp: 98.7 F (37.1 C)  Resp: 14    Date:  03/16/2012  Rate: 60  Rhythm: normal sinus rhythm  QRS Axis: normal  Intervals: normal  ST/T Wave abnormalities: normal  Conduction Disutrbances:none  Narrative Interpretation:   Old EKG Reviewed: unchanged from 09/22/2008  Work-up was essentially negative. Neg UA, CXR. Hgb stable. Patient has ambulated in the hallway at baseline with minimal assistance.   Patient was seen by Dr. Jeraldine Loots. Patient and family agree with PCP follow-up this week, return with worsening.   Patient urged to return with worsening symptoms or other concerns. Patient verbalized understanding and agrees with plan.   MDM  Generalized weakness without focal deficit. Do not suspect stroke. No focus or evidence of infection. No anemia. Patient appears well. She has good oversight from family and PCP follow-up. They are comfortable with this plan and will return with worsening. No objective signs of dehydration, mouth is dry 2/2 previous radiation.         Renne Crigler, [place] 03/16/12 [phone]

## 2012-03-17 ENCOUNTER — Other Ambulatory Visit (HOSPITAL_BASED_OUTPATIENT_CLINIC_OR_DEPARTMENT_OTHER): Payer: Medicare Other | Admitting: Lab

## 2012-03-17 ENCOUNTER — Telehealth: Payer: Self-pay | Admitting: *Deleted

## 2012-03-17 ENCOUNTER — Ambulatory Visit (HOSPITAL_BASED_OUTPATIENT_CLINIC_OR_DEPARTMENT_OTHER): Payer: Medicare Other | Admitting: Oncology

## 2012-03-17 ENCOUNTER — Telehealth: Payer: Self-pay | Admitting: Oncology

## 2012-03-17 VITALS — BP 118/60 | HR 54 | Temp 96.9°F | Resp 20 | Ht 68.0 in | Wt 123.6 lb

## 2012-03-17 DIAGNOSIS — C12 Malignant neoplasm of pyriform sinus: Secondary | ICD-10-CM

## 2012-03-17 DIAGNOSIS — E039 Hypothyroidism, unspecified: Secondary | ICD-10-CM

## 2012-03-17 DIAGNOSIS — R42 Dizziness and giddiness: Secondary | ICD-10-CM

## 2012-03-17 DIAGNOSIS — R5381 Other malaise: Secondary | ICD-10-CM

## 2012-03-17 DIAGNOSIS — R5383 Other fatigue: Secondary | ICD-10-CM

## 2012-03-17 LAB — TSH: TSH: 4.241 u[IU]/mL (ref 0.350–4.500)

## 2012-03-17 NOTE — Telephone Encounter (Signed)
appts made and printed for pt pt aware that cen. sch will call with ct appt     aom

## 2012-03-17 NOTE — Telephone Encounter (Signed)
Message copied by Wende Mott on Mon Mar 17, 2012  3:39 PM ------      Message from: HA, Raliegh Ip T      Created: Mon Mar 17, 2012  3:11 PM       Please call daughter.  Her Thyroid function was normal ruling out hypothyroidism as a cause of her fatigue.  Thanks.

## 2012-03-17 NOTE — Progress Notes (Signed)
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   PAST THERAPY: Status post definitive concurrent cisplatin every 3 weeks and daily radiation therapy. Chemotherapy concluded on 12/20/2008 and radiation therapy concluded on January 20, 2009.   CURRENT THERAPY: watchful observation.   INTERVAL HISTORY: Nancy Bates 71 y.o. female returns for regular follow up with her daughter. She reported feeling relatively well. However, yesterday, she had a bout of feeling fatigued. She has intermittent dizziness.  She denies headache, confusion, seizure, visual changes, focal motor weakness, syncope, bowel bladder incontinence. She presented to the ED with negative EKG and chest x-ray and was discharged home.  Her fatigue has resolved from yesterday. Appetite is stable. She gained a few pounds since her last visit.  She still has xerostomia. She denied fever, anorexia, weight loss, fatigue, headache, visual changes, confusion, drenching night sweats, palpable lymph node swelling, mucositis, odynophagia, dysphagia, nausea vomiting, jaundice, chest pain, palpitation, shortness of breath, dyspnea on exertion, productive cough, gum bleeding, epistaxis, hematemesis, hemoptysis, abdominal pain, abdominal swelling, early satiety, melena, hematochezia, hematuria, skin rash, spontaneous bleeding, joint swelling, joint pain, heat or cold intolerance, bowel bladder incontinence, back pain, focal motor weakness, paresthesia, depression, suicidal or homicidal ideation, feeling hopelessness.   Past Medical History  Diagnosis Date  . 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 COPD (chronic obstructive pulmonary disease)   . Hyperlipidemia   . Hypothyroidism   . Diabetes mellitus   . Cancer     sinus and neck ca  . Status post  radiation therapy 11/22/08 thru 01/20/09    Head and Neck for Sq. Cell of Hypopharynx  . Status post chemotherapy comp. 12/20/08    Cisplatin q 3 weeks Concurrent with Radiation  Therapy    Past Surgical History  Procedure Date  . Hiatal hernia repair   . Right ankle     S/p Fracture  . Direct laryngoscopy 07/07/08  . Debulking 07/07/08    Laser Debulking during direct Laryngoscopy  - Squmaous cell of the Pyriform Sinus     Current Outpatient Prescriptions  Medication Sig Dispense Refill  . albuterol (PROVENTIL,VENTOLIN) 90 MCG/ACT inhaler Inhale 2 puffs into the lungs every 4 (four) hours as needed. For shortness of breath      . antiseptic oral rinse (BIOTENE) LIQD 15 mLs by Mouth Rinse route every 2 (two) hours as needed. For dry mouth      . aspirin 81 MG tablet Take 81 mg by mouth daily.        Marland Kitchen CALCIUM-VITAMIN D PO Take 1 tablet by mouth daily.      . Cholecalciferol (VITAMIN D3) 2000 UNITS capsule Take 2,000 Units by mouth daily.      Marland Kitchen levothyroxine (SYNTHROID, LEVOTHROID) 50 MCG tablet Take 50 mcg by mouth See admin instructions. Mon-Fri only      . levothyroxine (SYNTHROID, LEVOTHROID) 75 MCG tablet Take 75 mcg by mouth 2 (two) times a week. Sat and Sun only      . loratadine (CLARITIN) 10 MG tablet Take 10 mg by mouth daily.        Marland Kitchen lovastatin (MEVACOR) 20 MG tablet Take 20 mg by mouth daily.        . sitaGLIPtin (JANUVIA) 100 MG tablet Take 100 mg by mouth daily.        Marland Kitchen  Sodium Fluoride (PREVIDENT 5000 DRY MOUTH) 1.1 % PSTE Place a thin ribbon of toothpaste onto tooth brush. Brush teeth at bedtime for 2 minutes. Spit out excess. Do not swallow. Do not rinse with water afterwards.  106 mL  one year  . tiotropium (SPIRIVA) 18 MCG inhalation capsule Place 18 mcg into inhaler and inhale daily.          ALLERGIES:  is allergic to penicillins.  REVIEW OF SYSTEMS:  The rest of the 14-point review of system was negative.   Filed Vitals:   03/17/12 0823  BP: 118/60  Pulse: 54    Temp: 96.9 F (36.1 C)  Resp: 20   Wt Readings from Last 3 Encounters:  03/17/12 123 lb 9.6 oz (56.065 kg)  03/16/12 122 lb (55.339 kg)  12/06/11 121 lb 14.4 oz (55.293 kg)   ECOG Performance status: 1  PHYSICAL EXAMINATION:  General:  well-nourished in no acute distress.  Eyes:  no scleral icterus.  ENT:  There were no oropharyngeal lesions.  Neck was without thyromegaly.  Lymphatics:  Negative cervical, supraclavicular or axillary adenopathy.  Respiratory: lungs were clear bilaterally without wheezing or crackles.  Cardiovascular:  Regular rate and rhythm, S1/S2, without murmur, rub or gallop.  There was no pedal edema.  GI:  abdomen was soft, flat, nontender, nondistended, without organomegaly.  Muscoloskeletal:  no spinal tenderness of palpation of vertebral spine.  Skin exam was without echymosis, petichae.  Neuro exam was nonfocal. CN II-XII grossly intact.  Motor 5/5 bilaterally in both upper and lower extremities.  There was no dysmetria.  Patient was able to get on and off exam table without assistance.  Gait was normal.  Patient was alerted and oriented.  Attention was good.   Language was appropriate.  Mood was normal without depression.  Speech was not pressured.  Thought content was not tangential.     LABORATORY/RADIOLOGY DATA:  Lab Results  Component Value Date   WBC 4.8 03/16/2012   HGB 12.0 03/16/2012   HCT 35.9* 03/16/2012   PLT 208 03/16/2012   GLUCOSE 88 03/16/2012   ALKPHOS 39 03/16/2012   ALT 5 03/16/2012   AST 14 03/16/2012   NA 140 03/16/2012   K 3.5 03/16/2012   CL 106 03/16/2012   CREATININE 0.81 03/16/2012   BUN 13 03/16/2012   CO2 27 03/16/2012   INR 1.1 10/27/2008    Dg Chest 2 View  03/16/2012  *RADIOLOGY REPORT*  Clinical Data: Shortness of breath for 1 day.  History diabetes, weakness, dizziness.  CHEST - 2 VIEW  Comparison: Chest x-ray 09/22/2008  Findings: The lungs are hyperinflated.  The heart size is normal. Hiatal hernia is present.  There  are no focal consolidations or pleural effusions.  Calcified mediastinal and hilar lymph nodes are identified.  Calcified areas of scarring are identified in the right upper lobe, stable in appearance.  No edema. Visualized osseous structures have a normal appearance.  Surgical clips are identified in the right axilla.  IMPRESSION:  1.  Hyperinflation. 2. No evidence for acute cardiopulmonary abnormality.   Original Report Authenticated By: Norva Pavlov, M.D.     ASSESSMENT AND PLAN:   1. History of head and neck squamous cell carcinoma: she has no evidence of disease recurrence or metastatic disease on clinical exam. I advised her to continue to refrain from smoking cigarettes, chewing tobacco, or drinking EtOH.  Given her fatigue and dizziness, I requested a head CT with IV contrast to rule out brain met.  2. Calorie-protein malnutrition. Her weight is stable. Her appetite now is better than before.  3. Hypothyroidism. She is on levothyroxine. Her TSH today is pending.  4. Diabetes mellitus. She is on sitagliptin per PCP. 5. Age appropriate cancer screening: She is due for mammogram within the next few months.  She has had negative Pap smear per her report and does not need Pap smear anymore. Her last colonoscopy was in 2010 which was negative. The next routine screening colonoscopy is in 2015. 6. Follow up: Lab and return visit with me in 4 months to ensure that her fatigue is not persistent. I advised to keep appointments with her ENT and Rad Onc between visits with [place] as well.    The length of time of the face-to-face encounter was 15  minutes. More than 50% of time was spent counseling and coordination of care.

## 2012-03-17 NOTE — Telephone Encounter (Signed)
Left VM for Daughter informing Thyroid function normal,  Not cause of fatigue.  Asked her to call back if any questions/concerns.

## 2012-03-18 LAB — URINE CULTURE: Colony Count: >=100000

## 2012-03-20 ENCOUNTER — Telehealth: Payer: Self-pay | Admitting: *Deleted

## 2012-03-20 ENCOUNTER — Encounter (HOSPITAL_COMMUNITY): Payer: Self-pay

## 2012-03-20 ENCOUNTER — Ambulatory Visit (HOSPITAL_COMMUNITY)
Admission: RE | Admit: 2012-03-20 | Discharge: 2012-03-20 | Disposition: A | Discharge status: Home / Self Care | Payer: Medicare Other | Source: Ambulatory Visit | Attending: Oncology | Admitting: Oncology

## 2012-03-20 ENCOUNTER — Other Ambulatory Visit: Payer: Self-pay | Admitting: Oncology

## 2012-03-20 DIAGNOSIS — C760 Malignant neoplasm of head, face and neck: Secondary | ICD-10-CM | POA: Insufficient documentation

## 2012-03-20 DIAGNOSIS — R42 Dizziness and giddiness: Secondary | ICD-10-CM

## 2012-03-20 DIAGNOSIS — C12 Malignant neoplasm of pyriform sinus: Secondary | ICD-10-CM

## 2012-03-20 DIAGNOSIS — R5383 Other fatigue: Secondary | ICD-10-CM

## 2012-03-20 DIAGNOSIS — M948X9 Other specified disorders of cartilage, unspecified sites: Secondary | ICD-10-CM | POA: Insufficient documentation

## 2012-03-20 MED ORDER — IOHEXOL 300 MG/ML  SOLN
100.0000 mL | Freq: Once | INTRAMUSCULAR | Status: AC | PRN
  Administered 2012-03-20: 100 mL via INTRAVENOUS

## 2012-03-20 NOTE — Telephone Encounter (Signed)
Called pt's daughter w/ results of CT scan per Dr. Gaylyn Rong negative for brain mets.  Daughter states pt has not had any further dizziness at this time. Continue to observe,  Call for any changes or concerns and keep appts as scheduled.  She verbalized understanding.

## 2012-03-20 NOTE — Telephone Encounter (Signed)
Message copied by Wende Mott on Thu Mar 20, 2012  9:04 AM ------      Message from: HA, Raliegh Ip T      Created: Thu Mar 20, 2012  8:44 AM       Please call pt's daughter.  Pt's head CT did not show evidence of brain met.  Therefore, I do not know the cause of her transient fatigue/dizziness. Continue observation at this time.  Thanks.

## 2012-03-25 ENCOUNTER — Other Ambulatory Visit: Payer: Self-pay | Admitting: Internal Medicine

## 2012-03-25 DIAGNOSIS — Z1231 Encounter for screening mammogram for malignant neoplasm of breast: Secondary | ICD-10-CM

## 2012-05-05 ENCOUNTER — Ambulatory Visit
Admission: RE | Admit: 2012-05-05 | Discharge: 2012-05-05 | Disposition: A | Discharge status: Home / Self Care | Payer: Medicare Other | Source: Ambulatory Visit | Attending: Internal Medicine | Admitting: Internal Medicine

## 2012-05-05 DIAGNOSIS — Z1231 Encounter for screening mammogram for malignant neoplasm of breast: Secondary | ICD-10-CM

## 2012-05-14 ENCOUNTER — Telehealth: Payer: Self-pay | Admitting: *Deleted

## 2012-05-14 NOTE — Telephone Encounter (Signed)
CALLED PATIENT TO ALTER FU VISIT FOR 06-12-12 DUE TO DR. MANNING BEING IN THE OR, RESCHEDULED FOR 06-19-12 AT 9:30 AM, PATIENT AGREED TO NEW TIME AND DATE

## 2012-06-12 ENCOUNTER — Ambulatory Visit: Payer: Medicare Other | Admitting: Radiation Oncology

## 2012-06-19 ENCOUNTER — Ambulatory Visit: Admission: RE | Admit: 2012-06-19 | Payer: Medicare Other | Source: Ambulatory Visit | Admitting: Radiation Oncology

## 2012-06-26 ENCOUNTER — Encounter: Payer: Self-pay | Admitting: Radiation Oncology

## 2012-06-26 ENCOUNTER — Ambulatory Visit
Admission: RE | Admit: 2012-06-26 | Discharge: 2012-06-26 | Disposition: A | Discharge status: Home / Self Care | Payer: Medicare Other | Source: Ambulatory Visit | Attending: Radiation Oncology | Admitting: Radiation Oncology

## 2012-06-26 VITALS — BP 118/62 | HR 98 | Temp 98.2°F | Resp 20

## 2012-06-26 DIAGNOSIS — K117 Disturbances of salivary secretion: Secondary | ICD-10-CM | POA: Insufficient documentation

## 2012-06-26 DIAGNOSIS — C12 Malignant neoplasm of pyriform sinus: Secondary | ICD-10-CM

## 2012-06-26 DIAGNOSIS — C139 Malignant neoplasm of hypopharynx, unspecified: Secondary | ICD-10-CM | POA: Insufficient documentation

## 2012-06-26 DIAGNOSIS — Y842 Radiological procedure and radiotherapy as the cause of abnormal reaction of the patient, or of later complication, without mention of misadventure at the time of the procedure: Secondary | ICD-10-CM | POA: Insufficient documentation

## 2012-06-26 DIAGNOSIS — E038 Other specified hypothyroidism: Secondary | ICD-10-CM | POA: Insufficient documentation

## 2012-06-26 NOTE — Progress Notes (Signed)
Patient here f/u s/p rad txs squamous cell ca hypopharynx 11/12/08-01/20/09 Alert,oriented x3, no c/o pain, ate eggs,fried potatoes and bacon ,milk today for breakfast, sometimes she gags on food not often, no c/o pain, meds same as last visit, last TSH done 03/17/12, cannot get results placed in patient "My CHart" last scoped in October patient states, No sob, 98% room air

## 2012-06-26 NOTE — Progress Notes (Signed)
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  She is essentially without complaint. 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 She is able to swallow foods relatively well with some modest dysphasia. She does report xerostomia.                              ALLERGIES:  is allergic to penicillins.  Meds: Current Outpatient Prescriptions  Medication Sig Dispense Refill  . albuterol (PROVENTIL,VENTOLIN) 90 MCG/ACT inhaler Inhale 2 puffs into the lungs every 4 (four) hours as needed. For shortness of breath      . antiseptic oral rinse (BIOTENE) LIQD 15 mLs by Mouth Rinse route every 2 (two) hours as needed. For dry mouth      . aspirin 81 MG tablet Take 81 mg by mouth daily.        Marland Kitchen CALCIUM-VITAMIN D PO Take 1 tablet by mouth daily.      . Cholecalciferol (VITAMIN D3) 2000 UNITS capsule Take 2,000 Units by mouth daily.      . CRESTOR 10 MG tablet       . levothyroxine (SYNTHROID, LEVOTHROID) 50 MCG tablet Take 50 mcg by mouth See admin instructions. Mon-Fri only      . levothyroxine (SYNTHROID, LEVOTHROID) 75 MCG tablet Take 75 mcg by mouth 2 (two) times a week. Sat and Sun only      . loratadine (CLARITIN) 10 MG tablet Take 10 mg by mouth daily.        Marland Kitchen lovastatin (MEVACOR) 20 MG tablet Take 20 mg by mouth daily.        . sitaGLIPtin (JANUVIA) 100 MG tablet Take 100 mg by mouth daily.        . Sodium Fluoride (PREVIDENT 5000 DRY MOUTH) 1.1 % PSTE Place a thin ribbon of toothpaste onto tooth brush. Brush teeth at bedtime for 2 minutes. Spit out excess. Do not swallow. Do not  rinse with water afterwards.  106 mL  one year  . tiotropium (SPIRIVA) 18 MCG inhalation capsule Place 18 mcg into inhaler and inhale daily.         No current facility-administered medications for this encounter.   Physical Findings: The patient is in no acute distress. Patient is alert and oriented.  oral temperature is 98.2 F (36.8 C). Her blood pressure is 118/62 and her pulse is 98. Her respiration is 20 and oxygen saturation is 99%. . Neck: Free of any appreciable lymphadenopathy. HEENT: Oral cavity contains no mucosal irregularities. The posterior oropharynx is symmetric and unremarkable. The patient tolerated indirect mirror exam without any difficulty. The indirect mirror showed good visualization of the posterior oropharynx and larynx. The true vocal cords in-tented symmetrically upon phonation. There was minimal supraglottic edema. The pyriform sinuses appeared patent. The entire oropharynx and larynx appeared dry with scant saliva.  Itic laryngoscopy was performed following local anesthesia. The epiglottis is free. The larynx was somewhat edematous with no mucosal irregularities. The true cords intended symmetrically upon phonation. The  puriform sinuses were patent. No significant changes.  Radiographic findings: Chest x-ray in November 2013 showed no metastases.  Impression:  The patient currently exhibits no evidence of disease. 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  Plan:  She will return to radiation oncology clinic for routine followup in one year.  _____________________________________  Artist Pais. Kathrynn Running, M.D.

## 2012-06-27 MED ORDER — LARYNGOSCOPY SOLUTION RAD-ONC
15.0000 mL | Freq: Once | TOPICAL | Status: AC
  Administered 2012-06-26: 15 mL via TOPICAL

## 2012-06-27 NOTE — Addendum Note (Signed)
Encounter addended by: Daylene Katayama, PHARMD on: 06/27/2012  2:36 PM<BR>     Documentation filed: Rx Order Verification

## 2012-06-27 NOTE — Addendum Note (Signed)
Encounter addended by: Delynn Flavin, RN on: 06/27/2012  2:46 PM<BR>     Documentation filed: Orders, Demographics Visit

## 2012-06-27 NOTE — Addendum Note (Signed)
Encounter addended by: Delynn Flavin, RN on: 06/27/2012  2:24 PM<BR>     Documentation filed: Charges VN

## 2012-06-27 NOTE — Addendum Note (Signed)
Encounter addended by: Tessa Lerner, RN on: 06/27/2012  3:55 PM<BR>     Documentation filed: Inpatient MAR

## 2012-07-21 ENCOUNTER — Encounter: Payer: Self-pay | Admitting: Oncology

## 2012-07-21 ENCOUNTER — Telehealth: Payer: Self-pay | Admitting: Oncology

## 2012-07-21 ENCOUNTER — Ambulatory Visit (HOSPITAL_BASED_OUTPATIENT_CLINIC_OR_DEPARTMENT_OTHER): Payer: Medicare Other | Admitting: Oncology

## 2012-07-21 ENCOUNTER — Other Ambulatory Visit (HOSPITAL_BASED_OUTPATIENT_CLINIC_OR_DEPARTMENT_OTHER): Payer: Medicare Other | Admitting: Lab

## 2012-07-21 VITALS — BP 120/82 | HR 83 | Temp 97.0°F | Resp 18 | Ht 68.0 in | Wt 125.1 lb

## 2012-07-21 DIAGNOSIS — E039 Hypothyroidism, unspecified: Secondary | ICD-10-CM

## 2012-07-21 DIAGNOSIS — C12 Malignant neoplasm of pyriform sinus: Secondary | ICD-10-CM

## 2012-07-21 DIAGNOSIS — R5383 Other fatigue: Secondary | ICD-10-CM

## 2012-07-21 LAB — TSH: TSH: 4.855 u[IU]/mL — ABNORMAL HIGH (ref 0.350–4.500)

## 2012-07-21 LAB — CBC WITH DIFFERENTIAL/PLATELET
BASO%: 0.5 % (ref 0.0–2.0)
Eosinophils Absolute: 0.1 10*3/uL (ref 0.0–0.5)
MCHC: 33.6 g/dL (ref 31.5–36.0)
MONO#: 0.6 10*3/uL (ref 0.1–0.9)
NEUT#: 1.9 10*3/uL (ref 1.5–6.5)
RBC: 4.34 10*6/uL (ref 3.70–5.45)
RDW: 14.3 % (ref 11.2–14.5)
WBC: 4 10*3/uL (ref 3.9–10.3)
lymph#: 1.4 10*3/uL (ref 0.9–3.3)

## 2012-07-21 LAB — COMPREHENSIVE METABOLIC PANEL (CC13)
ALT: <6 U/L (ref 0–55)
AST: 14 U/L (ref 5–34)
CO2: 26 mEq/L (ref 22–29)
Calcium: 9.3 mg/dL (ref 8.4–10.4)
Chloride: 107 mEq/L (ref 98–107)
Creatinine: 0.9 mg/dL (ref 0.6–1.1)
Sodium: 141 mEq/L (ref 136–145)
Total Protein: 7.2 g/dL (ref 6.4–8.3)

## 2012-07-21 NOTE — Progress Notes (Signed)
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   PAST THERAPY: Status post definitive concurrent cisplatin every 3 weeks and daily radiation therapy. Chemotherapy concluded on 12/20/2008 and radiation therapy concluded on January 20, 2009.   CURRENT THERAPY: watchful observation.   INTERVAL HISTORY: Nancy Bates 72 y.o. female returns for regular follow up with her daughter. She reported feeling relatively well. Fatigue and dizziness have resolved. She denies headache, confusion, seizure, visual changes, focal motor weakness, syncope, bowel bladder incontinence. Appetite and weight are stable.   She still has xerostomia. She denied fever, anorexia, weight loss, fatigue, headache, visual changes, confusion, drenching night sweats, palpable lymph node swelling, mucositis, odynophagia, dysphagia, nausea vomiting, jaundice, chest pain, palpitation, shortness of breath, dyspnea on exertion, productive cough, gum bleeding, epistaxis, hematemesis, hemoptysis, abdominal pain, abdominal swelling, early satiety, melena, hematochezia, hematuria, skin rash, spontaneous bleeding, joint swelling, joint pain, heat or cold intolerance, bowel bladder incontinence, back pain, focal motor weakness, paresthesia, depression, suicidal or homicidal ideation, feeling hopelessness.   Past Medical History  Diagnosis Date  . COPD (chronic obstructive pulmonary disease)   . Hyperlipidemia   . Hypothyroidism   . Diabetes mellitus   . 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 Status post chemotherapy comp. 12/20/08    Cisplatin q 3 weeks Concurrent with Radiation  Therapy  . 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 Cancer     sinus and neck ca    Past Surgical  History  Procedure Laterality Date  . Hiatal hernia repair    . Right ankle      S/p Fracture  . Direct laryngoscopy  07/07/08  . Debulking  07/07/08    Laser Debulking during direct Laryngoscopy  - Squmaous cell of the Pyriform Sinus     Current Outpatient Prescriptions  Medication Sig Dispense Refill  . albuterol (PROVENTIL,VENTOLIN) 90 MCG/ACT inhaler Inhale 2 puffs into the lungs every 4 (four) hours as needed. For shortness of breath      . antiseptic oral rinse (BIOTENE) LIQD 15 mLs by Mouth Rinse route every 2 (two) hours as needed. For dry mouth      . aspirin 81 MG tablet Take 81 mg by mouth daily.        Marland Kitchen CALCIUM-VITAMIN D PO Take 1 tablet by mouth daily.      . Cholecalciferol (VITAMIN D3) 2000 UNITS capsule Take 2,000 Units by mouth daily.      . CRESTOR 10 MG tablet       . levothyroxine (SYNTHROID, LEVOTHROID) 50 MCG tablet Take 50 mcg by mouth See admin instructions. Mon-Fri only      . levothyroxine (SYNTHROID, LEVOTHROID) 75 MCG tablet Take 75 mcg by mouth 2 (two) times a week. Sat and Sun only      . loratadine (CLARITIN) 10 MG tablet Take 10 mg by mouth daily.        Marland Kitchen lovastatin (MEVACOR) 20 MG tablet Take 20 mg by mouth daily.        . sitaGLIPtin (JANUVIA) 100 MG tablet Take 100 mg by mouth daily.        . Sodium Fluoride (PREVIDENT 5000 DRY MOUTH) 1.1 % PSTE Place a thin ribbon of toothpaste onto tooth brush. Brush  teeth at bedtime for 2 minutes. Spit out excess. Do not swallow. Do not rinse with water afterwards.  106 mL  one year  . tiotropium (SPIRIVA) 18 MCG inhalation capsule Place 18 mcg into inhaler and inhale daily.         No current facility-administered medications for this visit.    ALLERGIES:  is allergic to penicillins.  REVIEW OF SYSTEMS:  The rest of the 14-point review of system was negative.   Filed Vitals:   07/21/12 0910  BP: 120/82  Pulse: 83  Temp: 97 F (36.1 C)  Resp: 18   Wt Readings from Last 3 Encounters:  07/21/12 125 lb 1.6 oz  (56.745 kg)  03/17/12 123 lb 9.6 oz (56.065 kg)  03/16/12 122 lb (55.339 kg)   ECOG Performance status: 1  PHYSICAL EXAMINATION:  General:  well-nourished in no acute distress.  Eyes:  no scleral icterus.  ENT:  There were no oropharyngeal lesions.  Neck was without thyromegaly.  Lymphatics:  Negative cervical, supraclavicular or axillary adenopathy.  Respiratory: lungs were clear bilaterally without wheezing or crackles.  Cardiovascular:  Regular rate and rhythm, S1/S2, without murmur, rub or gallop.  There was no pedal edema.  GI:  abdomen was soft, flat, nontender, nondistended, without organomegaly.  Muscoloskeletal:  no spinal tenderness of palpation of vertebral spine.  Skin exam was without echymosis, petichae.  Neuro exam was nonfocal. Patient was able to get on and off exam table without assistance.  Gait was normal.  Patient was alerted and oriented.  Attention was good.   Language was appropriate.  Mood was normal without depression.  Speech was not pressured.  Thought content was not tangential.     LABORATORY/RADIOLOGY DATA:  Lab Results  Component Value Date   WBC 4.0 07/21/2012   HGB 12.3 07/21/2012   HCT 36.8 07/21/2012   PLT 217 07/21/2012   GLUCOSE 88 03/16/2012   ALKPHOS 39 03/16/2012   ALT 5 03/16/2012   AST 14 03/16/2012   NA 140 03/16/2012   K 3.5 03/16/2012   CL 106 03/16/2012   CREATININE 0.81 03/16/2012   BUN 13 03/16/2012   CO2 27 03/16/2012   INR 1.1 10/27/2008    ASSESSMENT AND PLAN:   1. History of head and neck squamous cell carcinoma: she has no evidence of disease recurrence or metastatic disease on clinical exam. I advised her to continue to refrain from smoking cigarettes, chewing tobacco, or drinking EtOH. Discussed with patient and daughter that there is no indication to obtain a CT scan unless she is symptomatic. They stated understanding.  2. Calorie-protein malnutrition. Her weight is stable. Her appetite now is better than before.   3. Hypothyroidism. She is on levothyroxine. Her TSH today is pending.  4. Diabetes mellitus. She is on sitagliptin per PCP. 5. Age appropriate cancer screening: Mammogram completed in 04/2012. She has had negative Pap smear per her report and does not need Pap smear anymore. Her last colonoscopy was in 2010 which was negative. The next routine screening colonoscopy is in 2015. 6. Follow up: Lab and return visit in 6 months. I advised to keep appointments with her ENT and Rad Onc between visits with [place] as well.    The length of time of the face-to-face encounter was 15  minutes. More than 50% of time was spent counseling and coordination of care.

## 2012-12-08 ENCOUNTER — Ambulatory Visit (HOSPITAL_COMMUNITY): Payer: Medicaid - Dental | Admitting: Dentistry

## 2012-12-08 ENCOUNTER — Encounter (HOSPITAL_COMMUNITY): Payer: Self-pay | Admitting: Dentistry

## 2012-12-08 VITALS — BP 106/67 | HR 74 | Temp 98.0°F

## 2012-12-08 DIAGNOSIS — K117 Disturbances of salivary secretion: Secondary | ICD-10-CM

## 2012-12-08 DIAGNOSIS — K121 Other forms of stomatitis: Secondary | ICD-10-CM

## 2012-12-08 DIAGNOSIS — K029 Dental caries, unspecified: Secondary | ICD-10-CM

## 2012-12-08 DIAGNOSIS — K0530 Chronic periodontitis, unspecified: Secondary | ICD-10-CM

## 2012-12-08 DIAGNOSIS — K036 Deposits [accretions] on teeth: Secondary | ICD-10-CM

## 2012-12-08 DIAGNOSIS — K0889 Other specified disorders of teeth and supporting structures: Secondary | ICD-10-CM

## 2012-12-08 MED ORDER — CHLORHEXIDINE GLUCONATE 0.12 % MT SOLN: OROMUCOSAL | Status: DC

## 2012-12-08 NOTE — Patient Instructions (Addendum)

## 2012-12-08 NOTE — Progress Notes (Signed)
12/08/2012  Patient:            Nancy Bates Date of Birth:  [birth year] MRN:                [id number]  BP 106/67  Pulse 74  Temp(Src) 98 F (36.7 C) (Oral)  Nancy Bates is a 72 year old female that presents for periodic oral exam and relation of lower left quadrant gum sensitivity.  Premedication: None require  Patient Active Problem List   Diagnosis Date Noted  . Pyriform sinus cancer   . Diabetes mellitus   . Hypothyroidism   . Hyperlipidemia   . COPD (chronic obstructive pulmonary disease)    Medical Hx Update:  Past Medical History  Diagnosis Date  . COPD (chronic obstructive pulmonary disease)   . Hyperlipidemia   . Hypothyroidism   . Diabetes mellitus   . 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 Status post chemotherapy comp. 12/20/08    Cisplatin q 3 weeks Concurrent with Radiation  Therapy  . 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 Cancer     sinus and neck ca  . ALLERGIES/ADVERSE DRUG REACTIONS: Allergies  Allergen Reactions  . Penicillins    MEDICATIONS: Current Outpatient Prescriptions  Medication Sig Dispense Refill  . albuterol (PROVENTIL,VENTOLIN) 90 MCG/ACT inhaler Inhale 2 puffs into the lungs every 4 (four) hours as needed. For shortness of breath      . antiseptic oral rinse (BIOTENE) LIQD 15 mLs by Mouth Rinse route every 2 (two) hours as needed. For dry mouth      . aspirin 81 MG tablet Take 81 mg by mouth daily.        Marland Kitchen CALCIUM-VITAMIN D PO Take 1 tablet by mouth daily.      . Cholecalciferol (VITAMIN D3) 2000 UNITS capsule Take 2,000 Units by mouth daily.      . CRESTOR 10 MG tablet 10 mg daily.       Marland Kitchen levothyroxine (SYNTHROID, LEVOTHROID) 75 MCG tablet Take 75 mcg by mouth daily. Patient is now on 75 mcg every day.      . loratadine (CLARITIN) 10 MG tablet Take 10 mg by mouth daily.        . sitaGLIPtin (JANUVIA) 100 MG tablet Take 100 mg by  mouth daily.        . Sodium Fluoride (PREVIDENT 5000 DRY MOUTH) 1.1 % PSTE Place a thin ribbon of toothpaste onto tooth brush. Brush teeth at bedtime for 2 minutes. Spit out excess. Do not swallow. Do not rinse with water afterwards.  106 mL  one year  . tiotropium (SPIRIVA) 18 MCG inhalation capsule Place 18 mcg into inhaler and inhale daily.         No current facility-administered medications for this visit.    C/C: Gum sensitivity of the lower left premolar area. 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 Patient underwent radiation therapy in 2010 with Dr. Margy Clarks. Patient had an upper complete and lower cast partial denture fabricated and inserted on 07/05/2009.  Patient has been followed for periodic oral exams and periodontal therapy and adjustment of dentures as needed over the past 3 years.  Patient was last seen in August of 2013. Dental caries were noted at that time but the patient did not followup for dental treatment  as instructed. Patient now presents complaining of some sensitivity to the lower left quadrant in the area of the premolar. Patient indicates that the pain is" tender" over the past several weeks.  Patient did not describe toothache symptoms, this most likely represents periodontal disease or possible dental hypersensitivity. Patient also has severe xerostomia and the discomfort may represent radiation-induced stomatitis pain.  DENTAL EXAM: General: Patient is a well-developed, slightly build female in no acute distress. Vitals: BP 106/67  Pulse 74  Temp(Src) 98 F (36.7 C) (Oral) Extraoral Exam: No lymphadenopathy.  No TMJ Symptoms. Intraoral  Exam: Severe xerostomia. No obvious dental abscesses noted. Dentition: Patient has an edentulous maxilla. Tooth numbers 20, 21, 22, 23, 24, 25, 26, 27, and 28 remain. Caries: Rampant dental caries are noted to be affecting multiple teeth  within the remaining dentition.   Endodontic: No obvious periapical pathology is noted.  Patient is complaining of some sensitivity within the mouth but I do not feel this is related to pulpal etiology, unless this is referred pain from the mandibular anterior teeth to the premolar teeth.  C&B: There are no crown restorations noted. Prosthodontic: Patient has an upper complete denture that she did not bring with her today. Patient has a lower cast partial denture that she did not bring with her today. Occlusion: Unable to assess completely due to lack of the dental prostheses. Radiographic Interpretation: 5 lower periapical radiograph were obtained. There are multiple dental caries noted. There is moderate bone loss noted. No obvious. Radiolucencies are noted.  ASSESSMENT: 1. Chronic periodontitis with bone loss 2. Generalized gingival recession 3. Tooth mobility 4. Plaque and calculus accumulations 5. Severe xerostomia 6. Radiation-induced stomatitis 7. Rampant dental caries 8. Multiple missing teeth 9. History of previous radiation therapy with need to assess previous ports and doses prior to anticipated dental extractions.  Plan:  1. I discussed the risks, benefits, complications of various treatment option relationship to the patient's medical and dental conditions, previous radiation therapy, severe xerostomia, and the potential risk for osteoradionecrosis and infection.  We discussed various treatment options to include no treatment, multiple extractions with alveoloplasty, pre-prosthetic surgery as indicated, periodontal therapy, dental restorations, root canal therapy, crown and bridge therapy, implant therapy, and replacement missing teeth is indicated after adequate healing. We discussed the potential dental coverage for various treatment options in relationship to her dental Medicaid.  The patient currently wishes to proceed with evaluation for extraction of remaining teeth with  alveoloplasty as indicated with an oral surgeon. Prior to that, I will contact the radiation oncologist to ensure that the remaining teeth were not in the primary field of radiation therapy. After adequate healing from the dental extractions, the patient may proceed with fabrication of upper lower complete dentures or just a lower denture to match the existing maxillary denture. Medicaid prior approval will need to be obtained for the dentures as indicated.   Charlynne Pander, DDS

## 2012-12-11 ENCOUNTER — Encounter: Payer: Self-pay | Admitting: Radiation Oncology

## 2012-12-23 ENCOUNTER — Encounter (HOSPITAL_COMMUNITY): Payer: Self-pay | Admitting: Dentistry

## 2013-01-22 ENCOUNTER — Encounter: Payer: Self-pay | Admitting: Hematology and Oncology

## 2013-01-22 ENCOUNTER — Other Ambulatory Visit: Payer: Self-pay | Admitting: Oncology

## 2013-01-22 ENCOUNTER — Other Ambulatory Visit (HOSPITAL_BASED_OUTPATIENT_CLINIC_OR_DEPARTMENT_OTHER): Payer: Medicare Other | Admitting: Lab

## 2013-01-22 ENCOUNTER — Telehealth: Payer: Self-pay | Admitting: Hematology and Oncology

## 2013-01-22 ENCOUNTER — Ambulatory Visit (HOSPITAL_BASED_OUTPATIENT_CLINIC_OR_DEPARTMENT_OTHER): Payer: Medicare Other | Admitting: Hematology and Oncology

## 2013-01-22 VITALS — BP 106/66 | HR 89 | Temp 97.9°F | Resp 20 | Ht 68.0 in | Wt 112.3 lb

## 2013-01-22 DIAGNOSIS — E039 Hypothyroidism, unspecified: Secondary | ICD-10-CM

## 2013-01-22 DIAGNOSIS — R634 Abnormal weight loss: Secondary | ICD-10-CM

## 2013-01-22 DIAGNOSIS — C12 Malignant neoplasm of pyriform sinus: Secondary | ICD-10-CM

## 2013-01-22 LAB — CBC WITH DIFFERENTIAL/PLATELET
BASO%: 0.6 % (ref 0.0–2.0)
EOS%: 3.2 % (ref 0.0–7.0)
HCT: 35.8 % (ref 34.8–46.6)
LYMPH%: 26.5 % (ref 14.0–49.7)
MCH: 25.6 pg (ref 25.1–34.0)
MCHC: 32.5 g/dL (ref 31.5–36.0)
MONO#: 1 10*3/uL — ABNORMAL HIGH (ref 0.1–0.9)
NEUT%: 52.7 % (ref 38.4–76.8)
Platelets: 310 10*3/uL (ref 145–400)
RBC: 4.55 10*6/uL (ref 3.70–5.45)
WBC: 6.1 10*3/uL (ref 3.9–10.3)
lymph#: 1.6 10*3/uL (ref 0.9–3.3)

## 2013-01-22 LAB — COMPREHENSIVE METABOLIC PANEL (CC13)
ALT: 7 U/L (ref 0–55)
CO2: 27 mEq/L (ref 22–29)
Calcium: 9.4 mg/dL (ref 8.4–10.4)
Chloride: 105 mEq/L (ref 98–109)
Creatinine: 0.8 mg/dL (ref 0.6–1.1)
Glucose: 91 mg/dl (ref 70–140)
Total Bilirubin: 0.34 mg/dL (ref 0.20–1.20)

## 2013-01-22 LAB — TSH CHCC: TSH: 0.123 m(IU)/L — ABNORMAL LOW (ref 0.308–3.960)

## 2013-01-22 MED ORDER — LEVOTHYROXINE SODIUM 50 MCG PO TABS: 50.0000 ug | ORAL_TABLET | Freq: Every day | ORAL | Status: DC

## 2013-01-22 NOTE — Telephone Encounter (Signed)
Gave pt appt for lab and MD for March  2015 °

## 2013-01-22 NOTE — Progress Notes (Signed)
[facility] OFFICE PROGRESS NOTE  Nancy Aliment, MD [address] [place] [address] No chief complaint on file.   DIAGNOSIS:   SUMMARY OF ONCOLOGIC HISTORY: I have reviewed her records extensively and collaborated the history with the patient. 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 She received concurrent chemotherapy with cisplatin every 3 weeks and daily radiation therapy. The radiation treatment was discontinued on 01/20/2009. Chemotherapy was discontinued on 12/20/2008. Her last CT scan in November 2014 was negative for disease recurrence  INTERVAL HISTORY: Nancy Bates 72 y.o. female returns for followup. There were no evidence of disease recurrence. She denies any signs and symptoms of swallowing difficulties, choking sensation, change in her voice, or difficulties opening her mouth. From prior chemotherapy, she still had persistent sensation of dry mouth and loss of taste sensation. As a consequence she had poor appetite and have mild weight loss. Recently, she saw a dentist for evaluation of poor dentition and dental extraction was recommended. She delay her surgery because she was not feeling well at that time. She denies any recent cough. No peripheral neuropathy from prior chemotherapy. Denies any headaches seizures or new neurological deficits.  I have reviewed the past medical history, past surgical history, social history and family history with the patient and they are unchanged from previous note.  ALLERGIES:  is allergic to penicillins.  MEDICATIONS: has a current medication list which includes the following prescription(s): albuterol, antiseptic oral rinse, aspirin, calcium-vitamin d, chlorhexidine, vitamin d3, crestor, levothyroxine, loratadine, sitagliptin, sodium fluoride, and tiotropium.  REVIEW OF SYSTEMS:   Constitutional: Denies fevers, chills or abnormal weight loss Eyes:  Denies blurriness of vision Ears, nose, mouth, throat, and face: Denies mucositis or sore throat Respiratory: Denies cough, dyspnea or wheezes Cardiovascular: Denies palpitation, chest discomfort or lower extremity swelling Gastrointestinal:  Denies nausea, heartburn or change in bowel habits Skin: Denies abnormal skin rashes Lymphatics: Denies new lymphadenopathy or easy bruising Neurological:Denies numbness, tingling or new weaknesses Behavioral/Psych: Mood is stable, no new changes  All other systems were reviewed with the patient and are negative.  PHYSICAL EXAMINATION: ECOG PERFORMANCE STATUS: 0 - Asymptomatic  Filed Vitals:   01/22/13 0835  BP: 106/66  Pulse: 89  Temp: 97.9 F (36.6 C)  Resp: 20    GENERAL:alert, no distress and comfortable. She looked thin and mildly cachectic SKIN: skin color, texture, turgor are normal, no rashes or significant lesions EYES: normal, Conjunctiva are pink and non-injected, sclera clear OROPHARYNX:no exudate, no erythema and lips, buccal mucosa, and tongue normal . Noted poor dentition. NECK: supple, thyroid normal size, non-tender, without nodularity LYMPH:  no palpable lymphadenopathy in the cervical, axillary or inguinal LUNGS: clear to auscultation and percussion with normal breathing effort HEART: regular rate & rhythm and no murmurs and no lower extremity edema ABDOMEN:abdomen soft, non-tender and normal bowel sounds Musculoskeletal:no cyanosis of digits and no clubbing  NEURO: alert & oriented x 3 with fluent speech, no focal motor/sensory deficits  LABORATORY DATA:  I have reviewed the data as listed    Component Value Date/Time   NA 141 01/22/2013 0814   NA 140 03/16/2012 0808   K 3.5 01/22/2013 0814   K 3.5 03/16/2012 0808   CL 107 07/21/2012 0822   CL 106 03/16/2012 0808   CO2 27 01/22/2013 0814   CO2 27 03/16/2012 0808   GLUCOSE 91 01/22/2013 0814   GLUCOSE 100* 07/21/2012 0822   GLUCOSE 88  03/16/2012 0808   BUN 9.7  01/22/2013 0814   BUN 13 03/16/2012 0808   CREATININE 0.8 01/22/2013 0814   CREATININE 0.81 03/16/2012 0808   CALCIUM 9.4 01/22/2013 0814   CALCIUM 9.0 03/16/2012 0808   PROT 8.1 01/22/2013 0814   PROT 7.2 03/16/2012 0808   ALBUMIN 3.0* 01/22/2013 0814   ALBUMIN 3.4* 03/16/2012 0808   AST 14 01/22/2013 0814   AST 14 03/16/2012 0808   ALT 7 01/22/2013 0814   ALT 5 03/16/2012 0808   ALKPHOS 56 01/22/2013 0814   ALKPHOS 39 03/16/2012 0808   BILITOT 0.34 01/22/2013 0814   BILITOT 0.3 03/16/2012 0808   GFRNONAA 70* 03/16/2012 0808   GFRAA 82* 03/16/2012 0808    I No results found for this basename: SPEP, UPEP,  kappa and lambda light chains    Lab Results  Component Value Date   WBC 6.1 01/22/2013   NEUTROABS 3.2 01/22/2013   HGB 11.7 01/22/2013   HCT 35.8 01/22/2013   MCV 78.8* 01/22/2013   PLT 310 01/22/2013      Chemistry      Component Value Date/Time   NA 141 01/22/2013 0814   NA 140 03/16/2012 0808   K 3.5 01/22/2013 0814   K 3.5 03/16/2012 0808   CL 107 07/21/2012 0822   CL 106 03/16/2012 0808   CO2 27 01/22/2013 0814   CO2 27 03/16/2012 0808   BUN 9.7 01/22/2013 0814   BUN 13 03/16/2012 0808   CREATININE 0.8 01/22/2013 0814   CREATININE 0.81 03/16/2012 0808      Component Value Date/Time   CALCIUM 9.4 01/22/2013 0814   CALCIUM 9.0 03/16/2012 0808   ALKPHOS 56 01/22/2013 0814   ALKPHOS 39 03/16/2012 0808   AST 14 01/22/2013 0814   AST 14 03/16/2012 0808   ALT 7 01/22/2013 0814   ALT 5 03/16/2012 0808   BILITOT 0.34 01/22/2013 0814   BILITOT 0.3 03/16/2012 0808       No results found for this basename: LABCA2    No components found with this basename: LABCA125    No results found for this basename: INR,  in the last 168 hours  Urinalysis    Component Value Date/Time   COLORURINE YELLOW 03/16/2012 1134   APPEARANCEUR CLEAR 03/16/2012 1134   LABSPEC 1.019 03/16/2012 1134   PHURINE 7.5 03/16/2012 1134   GLUCOSEU NEGATIVE 03/16/2012 1134   HGBUR NEGATIVE  03/16/2012 1134   BILIRUBINUR NEGATIVE 03/16/2012 1134   KETONESUR NEGATIVE 03/16/2012 1134   PROTEINUR NEGATIVE 03/16/2012 1134   UROBILINOGEN 0.2 03/16/2012 1134   NITRITE NEGATIVE 03/16/2012 1134   LEUKOCYTESUR SMALL* 03/16/2012 1134    ASSESSMENT: History of left piriform sinus squamous cell carcinoma, no evidence of disease recurrence.   PLAN:  #1 piriform sinus squamous cell carcinoma Showed no evidence of clinical recurrence. I would recommend continuing history and physical examination every 6 months for now and only reserve imaging studies to be done as needed if there is suspicion of clinical recurrence.  #2 mild weight loss  Her mild weight loss is secondary to poor appetite and loss of taste sensation. I discussed with her family member strategies to reduce further weight loss. I recommend nutritional supplement over-the-counter such as Ensure Plus 3 cans a day on top of her regular diet. I would not recommend appetite stimulant at this point. I am also going to check her thyroid function tests to make sure this is not related to overtreatment with her thyroid supplement.  #  3 hypothyroidism We will check a thyroid function test today and will call her if the results come back normal #4 preventive care Her preventive care measures are up-to-date. Her last mammogram a year ago was normal. She does not need further Pap smear. Next colonoscopy is not due until next year. She would need influenza vaccination but she will get it from her primary care provider.  All questions were answered. The patient knows to call the clinic with any problems, questions or concerns. We can certainly see the patient much sooner if necessary. No barriers to learning was detected.  The patient and plan discussed with [facility], Chi Garlow  and she is in agreement with the aforementioned.  I spent 25 minutes counseling the patient face to face. The total time spent in the appointment was 40 minutes and more than  50% was on counseling.     Angelena Sand, MD 01/22/2013 8:59 AM

## 2013-01-23 ENCOUNTER — Telehealth: Payer: Self-pay | Admitting: *Deleted

## 2013-01-23 NOTE — Telephone Encounter (Signed)
Message copied by Wende Mott on Fri Jan 23, 2013  9:38 AM ------      Message from: Clenton Pare R      Created: Thu Jan 22, 2013  8:36 PM       Call pt. TSH is low indicating that we need to reduce her dose of Synthroid. Recommend that she decrease to 50 mcg daily. Recheck TSH in about 2 months. I have sent Rx to [facility] on [address] and have also sent POF for lab in 2 months. ------

## 2013-01-23 NOTE — Telephone Encounter (Signed)
Spoke w/ daughter and informed of need to decrease Synthroid to 50 mcg, rx sent to [facility].  Scheduler will call w/ lab appt in 2 months.  She verbalized understanding.

## 2013-01-27 ENCOUNTER — Telehealth: Payer: Self-pay | Admitting: Hematology and Oncology

## 2013-01-27 NOTE — Telephone Encounter (Signed)
lvm for pt regarding to nov and March appts.Marland KitchenMarland KitchenMarland Kitchen

## 2013-01-28 ENCOUNTER — Telehealth (HOSPITAL_COMMUNITY): Payer: Self-pay | Admitting: Dentistry

## 2013-01-28 NOTE — Telephone Encounter (Signed)
01/28/2013  Patient:            Nancy Bates Date of Birth:  June 14, 1940 MRN:                [id number]  I called the patient at home and discussed previous referral to the oral surgeon for consideration of extraction of remaining teeth. The patient missed that appointment on 01/20/2013 due to illness. I also discussed possible removal of remaining teeth with alveoloplasty in the operating room with dental medicine if she so desired. The patient indicates that she no longer wishes to proceed with extraction of any teeth at this time. The patient understands the potential risk for infection due to the extensive dental caries. The patient will call back if she desires any future dental treatment.  Charlynne Pander, DDS

## 2013-03-23 ENCOUNTER — Other Ambulatory Visit (HOSPITAL_BASED_OUTPATIENT_CLINIC_OR_DEPARTMENT_OTHER): Payer: Medicare Other

## 2013-03-23 DIAGNOSIS — C12 Malignant neoplasm of pyriform sinus: Secondary | ICD-10-CM

## 2013-03-23 DIAGNOSIS — E039 Hypothyroidism, unspecified: Secondary | ICD-10-CM

## 2013-03-23 LAB — CBC WITH DIFFERENTIAL/PLATELET
BASO%: 0.2 % (ref 0.0–2.0)
Basophils Absolute: 0 10*3/uL (ref 0.0–0.1)
EOS%: 1.7 % (ref 0.0–7.0)
HCT: 32.2 % — ABNORMAL LOW (ref 34.8–46.6)
HGB: 10.5 g/dL — ABNORMAL LOW (ref 11.6–15.9)
MCH: 25.1 pg (ref 25.1–34.0)
MCHC: 32.6 g/dL (ref 31.5–36.0)
MONO#: 0.7 10*3/uL (ref 0.1–0.9)
RDW: 16.7 % — ABNORMAL HIGH (ref 11.2–14.5)
WBC: 5.7 10*3/uL (ref 3.9–10.3)
lymph#: 1.6 10*3/uL (ref 0.9–3.3)

## 2013-03-23 LAB — COMPREHENSIVE METABOLIC PANEL (CC13)
ALT: <6 U/L (ref 0–55)
AST: 15 U/L (ref 5–34)
Albumin: 2.9 g/dL — ABNORMAL LOW (ref 3.5–5.0)
Alkaline Phosphatase: 53 U/L (ref 40–150)
Calcium: 9.6 mg/dL (ref 8.4–10.4)
Chloride: 105 mEq/L (ref 98–109)
Potassium: 3.5 mEq/L (ref 3.5–5.1)
Sodium: 139 mEq/L (ref 136–145)
Total Protein: 7.9 g/dL (ref 6.4–8.3)

## 2013-03-24 ENCOUNTER — Encounter: Payer: Self-pay | Admitting: Hematology and Oncology

## 2013-03-24 ENCOUNTER — Other Ambulatory Visit: Payer: Self-pay | Admitting: Hematology and Oncology

## 2013-03-24 DIAGNOSIS — E039 Hypothyroidism, unspecified: Secondary | ICD-10-CM

## 2013-03-24 DIAGNOSIS — R718 Other abnormality of red blood cells: Secondary | ICD-10-CM

## 2013-03-24 DIAGNOSIS — C12 Malignant neoplasm of pyriform sinus: Secondary | ICD-10-CM

## 2013-03-24 HISTORY — DX: Other abnormality of red blood cells: R71.8

## 2013-05-08 ENCOUNTER — Other Ambulatory Visit: Payer: Self-pay | Admitting: Oncology

## 2013-06-23 ENCOUNTER — Ambulatory Visit (HOSPITAL_COMMUNITY): Payer: Medicaid - Dental | Admitting: Dentistry

## 2013-06-25 ENCOUNTER — Ambulatory Visit: Payer: Medicare Other | Admitting: Radiation Oncology

## 2013-07-02 ENCOUNTER — Ambulatory Visit: Payer: Medicare Other | Admitting: Radiation Oncology

## 2013-07-07 ENCOUNTER — Ambulatory Visit (HOSPITAL_COMMUNITY): Payer: Self-pay | Admitting: Dentistry

## 2013-07-13 ENCOUNTER — Ambulatory Visit (HOSPITAL_COMMUNITY): Payer: Self-pay | Admitting: Dentistry

## 2013-07-14 ENCOUNTER — Encounter (INDEPENDENT_AMBULATORY_CARE_PROVIDER_SITE_OTHER): Payer: Self-pay

## 2013-07-14 ENCOUNTER — Ambulatory Visit (HOSPITAL_COMMUNITY): Payer: Medicaid - Dental | Admitting: Dentistry

## 2013-07-14 VITALS — BP 98/67 | HR 99 | Temp 98.4°F

## 2013-07-14 DIAGNOSIS — K029 Dental caries, unspecified: Secondary | ICD-10-CM

## 2013-07-14 DIAGNOSIS — R682 Dry mouth, unspecified: Secondary | ICD-10-CM

## 2013-07-14 DIAGNOSIS — Z923 Personal history of irradiation: Secondary | ICD-10-CM

## 2013-07-14 DIAGNOSIS — K0889 Other specified disorders of teeth and supporting structures: Secondary | ICD-10-CM

## 2013-07-14 DIAGNOSIS — K045 Chronic apical periodontitis: Secondary | ICD-10-CM

## 2013-07-14 DIAGNOSIS — K0530 Chronic periodontitis, unspecified: Secondary | ICD-10-CM

## 2013-07-14 DIAGNOSIS — C12 Malignant neoplasm of pyriform sinus: Secondary | ICD-10-CM

## 2013-07-14 DIAGNOSIS — K083 Retained dental root: Secondary | ICD-10-CM

## 2013-07-14 DIAGNOSIS — K117 Disturbances of salivary secretion: Secondary | ICD-10-CM

## 2013-07-14 DIAGNOSIS — K08409 Partial loss of teeth, unspecified cause, unspecified class: Secondary | ICD-10-CM

## 2013-07-14 NOTE — Progress Notes (Signed)
07/14/2013  Patient:            Nancy Bates Date of Birth:  09/24/1940 MRN:                [id number]  BP 98/67  Pulse 99  Temp(Src) 98.4 F (36.9 C) (Oral)  Nancy Bates is a 73 year old female that presents for periodic oral exam and evaluation of broken mandibular anterior tooth. Patient was last seen in August of 2014. At that time the patient was referred to an oral surgeon for extraction of remaining teeth. The patient subsequently decided that she did not want to proceed with extractions at that time. Patient was to call when she is ready to proceed with dental extraction procedures. Patient subsequently contacted dental medicine in February of 2015 complaining of a broken mandibular anterior tooth. After multiple broken appointments the patient presents today for evaluation of broken mandibular anterior tooth #23 and discussion of treatment options. The patient currently denies acute toothache symptoms associated with broken tooth #23.  Patient Active Problem List   Diagnosis Date Noted  . Microcytosis 03/24/2013  . Pyriform sinus cancer   . Diabetes mellitus   . Hypothyroidism   . Hyperlipidemia   . COPD (chronic obstructive pulmonary disease)    Medical Hx Update:  Past Medical History  Diagnosis Date  . COPD (chronic obstructive pulmonary disease)   . Hyperlipidemia   . Hypothyroidism   . Diabetes mellitus   . 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 Status post chemotherapy comp. 12/20/08    Cisplatin q 3 weeks Concurrent with Radiation  Therapy  . 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 Cancer     sinus and neck ca  . Microcytosis 03/24/2013  . ALLERGIES/ADVERSE DRUG REACTIONS: Allergies  Allergen Reactions  . Penicillins    MEDICATIONS: Current Outpatient Prescriptions  Medication Sig Dispense Refill  . albuterol (PROVENTIL,VENTOLIN) 90 MCG/ACT inhaler Inhale  2 puffs into the lungs every 4 (four) hours as needed. For shortness of breath      . antiseptic oral rinse (BIOTENE) LIQD 15 mLs by Mouth Rinse route every 2 (two) hours as needed. For dry mouth      . aspirin 81 MG tablet Take 81 mg by mouth daily.        Marland Kitchen CALCIUM-VITAMIN D PO Take 1 tablet by mouth daily.      . chlorhexidine (PERIDEX) 0.12 % solution Rinse with 15 mls twice daily for 30 seconds. Use after breakfast and at bedtime. Spit out excess. Do not swallow.  480 mL  6  . Cholecalciferol (VITAMIN D3) 2000 UNITS capsule Take 2,000 Units by mouth daily.      . CRESTOR 10 MG tablet 10 mg daily.       Marland Kitchen levothyroxine (SYNTHROID) 50 MCG tablet Take 1 tablet (50 mcg total) by mouth daily before breakfast.  30 tablet  2  . loratadine (CLARITIN) 10 MG tablet Take 10 mg by mouth daily.        . sitaGLIPtin (JANUVIA) 100 MG tablet Take 100 mg by mouth daily.        . Sodium Fluoride (PREVIDENT 5000 DRY MOUTH) 1.1 % PSTE Place a thin ribbon of toothpaste onto tooth brush. Brush teeth at bedtime for 2 minutes. Spit out excess. Do not swallow. Do not rinse with water afterwards.  106 mL  one  year  . tiotropium (SPIRIVA) 18 MCG inhalation capsule Place 18 mcg into inhaler and inhale daily.         No current facility-administered medications for this visit.    C/C: Broken mandibular anterior tooth. HPI:  Nancy Bates is a 73 year old female that presents for periodic oral exam and evaluation of broken mandibular anterior tooth. Patient was last seen in August of 2014. At that time the patient was referred to an oral surgeon for extraction of remaining teeth. The patient subsequently decided that she did not want to proceed with extractions at that time. Patient was to call when she was ready to proceed with dental extraction procedures. Patient subsequently contacted dental medicine in February of 2015 complaining of a broken mandibular anterior tooth. After multiple broken appointments, the  patient presents today for evaluation of broken mandibular anterior tooth #23 and discussion of treatment options. The patient currently denies acute toothache symptoms associated with broken tooth #23.  DENTAL EXAM: General: Patient is a well-developed, slightly build female in no acute distress. Vitals: BP 98/67  Pulse 99  Temp(Src) 98.4 F (36.9 C) (Oral) Extraoral Exam: No lymphadenopathy.  No TMJ Symptoms. Intraoral  Exam: Severe xerostomia. No obvious dental abscesses noted. Dentition: Patient has an edentulous maxilla. Tooth numbers 20, 21, 22, 23, 24, 25, 26, 27, and 28 remain. Tooth #23 is not present as a retained root segment. Caries: Rampant dental caries are noted to be affecting multiple teeth within the remaining dentition.   Endodontic: There is periapical radiolucency associated with the apex of tooth #23. The patient denies acute pulpitis symptoms however.   C&B: There are no crown restorations noted. Prosthodontic: Patient has an upper complete denture that she did not bring with her today. Patient has a lower cast partial denture that she did not bring with her today. Occlusion: Unable to assess completely due to lack of the dental prostheses. Radiographic Interpretation: Lower PA #23. Retained root segment #23. Periapical radiolucency as apex of tooth #23.  ASSESSMENT: 1. 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 Status post radiation therapy 3. Chronic periodontitis with bone loss 4. Generalized gingival recession 5. Tooth mobility 6. Plaque and calculus accumulations 7. Severe xerostomia 8.  Rampant dental caries 9.  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 Retained root segment #23.  11. Multiple missing teeth  Plan:  1. I discussed the risks, benefits, complications of various treatment options in relationship to the patient's medical and dental conditions, previous radiation therapy, severe xerostomia, and the potential risk for  osteoradionecrosis and infection.  We discussed various treatment options to include no treatment, multiple extractions with alveoloplasty, pre-prosthetic surgery as indicated, periodontal therapy, dental restorations, root canal therapy, crown and bridge therapy, implant therapy, and replacement of missing teeth as indicated after adequate healing. We discussed the potential dental coverage for various treatment options in relationship to her dental Medicaid.  The patient currently wishes to proceed with extraction of all remaining teeth with alveoloplasty as indicated in the Operating room with general anesthesia. Patient will need to see her primary physician, Dr. Baird Cancer, or Dr. Alvy Bimler for clearance for the operating room procedure. The patient will then proceed with extraction of remaining teeth and have a lower complete denture fabricated after adequate healing and once prior approval from Medicaid is obtained.   Lenn Cal, DDS

## 2013-07-14 NOTE — Patient Instructions (Addendum)
The patient is to have medical evaluation by Dr. Baird Cancer or Dr. Alvy Bimler for physical examination prior to scheduling dental operating room procedure. Operating room procedure will then be scheduled for the removal of all remaining teeth with alveoloplasty. Patient will then have lower complete denture fabricated after adequate healing. Patient to call if acute problems arise before then. Dr. Enrique Sack

## 2013-07-16 ENCOUNTER — Telehealth: Payer: Self-pay | Admitting: Hematology and Oncology

## 2013-07-16 NOTE — Telephone Encounter (Signed)
pt called to r/s appt..done...pt aware of new d.t °

## 2013-07-20 ENCOUNTER — Telehealth: Payer: Self-pay | Admitting: *Deleted

## 2013-07-20 NOTE — Telephone Encounter (Signed)
Pt called to cancel her appt for 07/28/13 and to rs. gv appt for 08/03/13 w/ labs@ 1:15pm and ov@ 1:45pm. Pt is aware...td

## 2013-07-22 ENCOUNTER — Other Ambulatory Visit: Payer: Self-pay

## 2013-07-22 ENCOUNTER — Ambulatory Visit: Payer: Self-pay | Admitting: Hematology and Oncology

## 2013-07-23 ENCOUNTER — Encounter: Payer: Self-pay | Admitting: Radiation Oncology

## 2013-07-23 ENCOUNTER — Ambulatory Visit
Admission: RE | Admit: 2013-07-23 | Discharge: 2013-07-23 | Disposition: A | Discharge status: Home / Self Care | Payer: Medicare Other | Source: Ambulatory Visit | Attending: Radiation Oncology | Admitting: Radiation Oncology

## 2013-07-23 VITALS — BP 111/85 | HR 86 | Temp 97.5°F | Resp 20 | Wt 103.4 lb

## 2013-07-23 DIAGNOSIS — C12 Malignant neoplasm of pyriform sinus: Secondary | ICD-10-CM

## 2013-07-23 NOTE — Progress Notes (Signed)
Pt denies pain, loss of appetite, difficulty swallowing. She reports dry mouth and fatigue. She denies dysphagia and states she is able to eat any foods she wants. Daughter states pt does not drink onutritional sup[plements but did drink nutritional juices during treatment daughter states she is unable to find these at drug stores. Called Dory Peru, nutritionist and obtained information for pt's daughter for source of nutritional supplement, Boost Breeze.

## 2013-07-26 ENCOUNTER — Encounter: Payer: Self-pay | Admitting: Radiation Oncology

## 2013-07-26 NOTE — Progress Notes (Signed)
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  She is essentially without complaint. She was diagnosed with postradiation hypothyroidism two years ago. She is able to swallow foods relatively well.    Pt denies pain, loss of appetite, difficulty swallowing. She reports dry mouth and fatigue. She denies dysphagia and states she is able to eat any foods she wants.                            ALLERGIES:  is allergic to penicillins.  Meds: Current Outpatient Prescriptions  Medication Sig Dispense Refill  . albuterol (PROVENTIL,VENTOLIN) 90 MCG/ACT inhaler Inhale 2 puffs into the lungs every 4 (four) hours as needed. For shortness of breath      . antiseptic oral rinse (BIOTENE) LIQD 15 mLs by Mouth Rinse route every 2 (two) hours as needed. For dry mouth      . aspirin 81 MG tablet Take 81 mg by mouth daily.        Marland Kitchen CALCIUM-VITAMIN D PO Take 1 tablet by mouth daily.      . chlorhexidine (PERIDEX) 0.12 % solution Rinse with 15 mls twice daily for 30 seconds. Use after breakfast and at bedtime. Spit out excess. Do not swallow.  480 mL  6  . Cholecalciferol (VITAMIN D3) 2000 UNITS capsule Take 2,000 Units by mouth daily.      . CRESTOR 10 MG tablet 10 mg daily.       Marland Kitchen levothyroxine (SYNTHROID) 50 MCG tablet Take 1 tablet (50 mcg total) by mouth daily before breakfast.  30 tablet  2  . loratadine (CLARITIN) 10 MG tablet Take 10 mg by mouth daily.        . sitaGLIPtin (JANUVIA) 100 MG tablet Take 100 mg by mouth daily.        . Sodium Fluoride (PREVIDENT 5000 DRY MOUTH) 1.1 % PSTE Place a  thin ribbon of toothpaste onto tooth brush. Brush teeth at bedtime for 2 minutes. Spit out excess. Do not swallow. Do not rinse with water afterwards.  106 mL  one year  . tiotropium (SPIRIVA) 18 MCG inhalation capsule Place 18 mcg into inhaler and inhale daily.         No current facility-administered medications for this encounter.    Physical Findings: The patient is in no acute distress. Patient is alert and oriented.  weight is 103 lb 6.4 oz (46.902 kg). Her oral temperature is 97.5 F (36.4 C). Her blood pressure is 111/85 and her pulse is 86. Her respiration is 20. .Neck: Free of any appreciable lymphadenopathy. HEENT: Oral cavity contains no mucosal irregularities. The posterior oropharynx is symmetric and unremarkable. The patient tolerated indirect mirror exam without any difficulty. The indirect mirror showed good visualization of the posterior oropharynx and larynx. The true vocal cords in-tented symmetrically upon phonation. There was minimal supraglottic edema. The pyriform sinuses appeared patent. The entire oropharynx and larynx appeared dry with scant saliva.  No significant changes.  Lab Findings: Lab Results  Component Value  Date   WBC 5.7 03/23/2013   HGB 10.5* 03/23/2013   HCT 32.2* 03/23/2013   MCV 76.8* 03/23/2013   PLT 309 03/23/2013   Impression:  The patient has no evidence of disease.  Plan:  Followup in one year  _____________________________________  Sheral Apley. Tammi Klippel, M.D.

## 2013-07-28 ENCOUNTER — Other Ambulatory Visit: Payer: Self-pay

## 2013-07-28 ENCOUNTER — Ambulatory Visit: Payer: Self-pay | Admitting: Hematology and Oncology

## 2013-08-03 ENCOUNTER — Ambulatory Visit (HOSPITAL_BASED_OUTPATIENT_CLINIC_OR_DEPARTMENT_OTHER): Payer: Medicare Other | Admitting: Hematology and Oncology

## 2013-08-03 ENCOUNTER — Other Ambulatory Visit (HOSPITAL_BASED_OUTPATIENT_CLINIC_OR_DEPARTMENT_OTHER): Payer: Medicare Other

## 2013-08-03 ENCOUNTER — Telehealth: Payer: Self-pay | Admitting: Hematology and Oncology

## 2013-08-03 ENCOUNTER — Encounter: Payer: Self-pay | Admitting: Hematology and Oncology

## 2013-08-03 VITALS — BP 115/70 | HR 101 | Temp 97.5°F | Resp 20 | Ht 68.0 in | Wt 102.8 lb

## 2013-08-03 DIAGNOSIS — R718 Other abnormality of red blood cells: Secondary | ICD-10-CM

## 2013-08-03 DIAGNOSIS — R634 Abnormal weight loss: Secondary | ICD-10-CM | POA: Insufficient documentation

## 2013-08-03 DIAGNOSIS — C12 Malignant neoplasm of pyriform sinus: Secondary | ICD-10-CM

## 2013-08-03 DIAGNOSIS — E039 Hypothyroidism, unspecified: Secondary | ICD-10-CM

## 2013-08-03 DIAGNOSIS — E46 Unspecified protein-calorie malnutrition: Secondary | ICD-10-CM | POA: Insufficient documentation

## 2013-08-03 DIAGNOSIS — D649 Anemia, unspecified: Secondary | ICD-10-CM

## 2013-08-03 HISTORY — DX: Abnormal weight loss: R63.4

## 2013-08-03 LAB — IRON AND TIBC CHCC
%SAT: 15 % — ABNORMAL LOW (ref 21–57)
Iron: 41 ug/dL (ref 41–142)
TIBC: 269 ug/dL (ref 236–444)
UIBC: 228 ug/dL (ref 120–384)

## 2013-08-03 LAB — COMPREHENSIVE METABOLIC PANEL (CC13)
ALBUMIN: 2.9 g/dL — AB (ref 3.5–5.0)
ALT: <6 U/L (ref 0–55)
ANION GAP: 11 meq/L (ref 3–11)
AST: 15 U/L (ref 5–34)
Alkaline Phosphatase: 59 U/L (ref 40–150)
BUN: 10.4 mg/dL (ref 7.0–26.0)
CALCIUM: 9.6 mg/dL (ref 8.4–10.4)
CO2: 24 meq/L (ref 22–29)
Chloride: 108 mEq/L (ref 98–109)
Creatinine: 0.8 mg/dL (ref 0.6–1.1)
Glucose: 104 mg/dl (ref 70–140)
POTASSIUM: 3.7 meq/L (ref 3.5–5.1)
SODIUM: 142 meq/L (ref 136–145)
TOTAL PROTEIN: 8.1 g/dL (ref 6.4–8.3)
Total Bilirubin: 0.34 mg/dL (ref 0.20–1.20)

## 2013-08-03 LAB — CBC WITH DIFFERENTIAL/PLATELET
BASO%: 0.9 % (ref 0.0–2.0)
BASOS ABS: 0.1 10*3/uL (ref 0.0–0.1)
EOS ABS: 0.1 10*3/uL (ref 0.0–0.5)
EOS%: 1.5 % (ref 0.0–7.0)
HCT: 33.6 % — ABNORMAL LOW (ref 34.8–46.6)
HGB: 10.8 g/dL — ABNORMAL LOW (ref 11.6–15.9)
LYMPH%: 17.9 % (ref 14.0–49.7)
MCH: 25.2 pg (ref 25.1–34.0)
MCHC: 32 g/dL (ref 31.5–36.0)
MCV: 78.6 fL — ABNORMAL LOW (ref 79.5–101.0)
MONO#: 0.9 10*3/uL (ref 0.1–0.9)
MONO%: 12.8 % (ref 0.0–14.0)
NEUT%: 66.9 % (ref 38.4–76.8)
NEUTROS ABS: 4.8 10*3/uL (ref 1.5–6.5)
Platelets: 336 10*3/uL (ref 145–400)
RBC: 4.27 10*6/uL (ref 3.70–5.45)
RDW: 17.3 % — AB (ref 11.2–14.5)
WBC: 7.2 10*3/uL (ref 3.9–10.3)
lymph#: 1.3 10*3/uL (ref 0.9–3.3)

## 2013-08-03 LAB — T4, FREE: Free T4: 1.17 ng/dL (ref 0.80–1.80)

## 2013-08-03 LAB — FERRITIN CHCC: Ferritin: 76 ng/ml (ref 9–269)

## 2013-08-03 LAB — TSH CHCC: TSH: 1.797 m[IU]/L (ref 0.308–3.960)

## 2013-08-03 NOTE — Telephone Encounter (Signed)
per pof per Dr Alvy Bimler to sch pt for 4/13 @2 :30-printed sch for pt

## 2013-08-03 NOTE — Progress Notes (Signed)
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 She received concurrent chemotherapy with cisplatin every 3 weeks and daily radiation therapy. The radiation treatment was discontinued on 01/20/2009. Chemotherapy was discontinued on 12/20/2008. Her last CT scan in November 2014 was negative for disease recurrence INTERVAL HISTORY: Nancy Bates 73 y.o. female returns for further followup. She has progressive weight loss and lost another 10 pounds. Within the last year, she has lost 25 pounds. She has poor appetite. She has persistent sensation of dry mouth and altered taste. She denies any choking sensation.  I have reviewed the past medical history, past surgical history, social history and family history with the patient and they are unchanged from previous note.  ALLERGIES:  is allergic to penicillins.  MEDICATIONS:  Current Outpatient Prescriptions  Medication Sig Dispense Refill  . albuterol (PROVENTIL,VENTOLIN) 90 MCG/ACT inhaler Inhale 2 puffs into the lungs every 4 (four) hours as needed. For shortness of breath      . antiseptic oral rinse (BIOTENE) LIQD 15 mLs by Mouth Rinse route every 2 (two) hours as needed. For dry mouth      . aspirin 81 MG tablet Take 81 mg by mouth daily.        Marland Kitchen CALCIUM-VITAMIN D PO Take 1 tablet by mouth daily.      . chlorhexidine (PERIDEX) 0.12 % solution Rinse with 15 mls twice daily for 30  seconds. Use after breakfast and at bedtime. Spit out excess. Do not swallow.  480 mL  6  . Cholecalciferol (VITAMIN D3) 2000 UNITS capsule Take 2,000 Units by mouth daily.      . CRESTOR 10 MG tablet 10 mg daily.       Marland Kitchen levothyroxine (SYNTHROID) 50 MCG tablet Take 1 tablet (50 mcg total) by mouth daily before breakfast.  30 tablet  2  . loratadine (CLARITIN) 10 MG tablet Take 10 mg by mouth daily.        . sitaGLIPtin (JANUVIA) 100 MG tablet Take 100 mg by mouth daily.        . Sodium Fluoride (PREVIDENT 5000 DRY MOUTH) 1.1 % PSTE Place a thin ribbon of toothpaste onto tooth brush. Brush teeth at bedtime for 2 minutes. Spit out excess. Do not swallow. Do not rinse with water afterwards.  106 mL  one year  . tiotropium (SPIRIVA) 18 MCG inhalation capsule Place 18 mcg into inhaler and inhale daily.         No current facility-administered medications for this visit.    REVIEW OF SYSTEMS:   Constitutional: Denies fevers, chills  Eyes: Denies blurriness of vision Ears, nose, mouth, throat, and face: Denies mucositis or sore throat Respiratory: Denies cough, dyspnea or wheezes Cardiovascular: Denies palpitation, chest discomfort or lower extremity swelling Gastrointestinal:  Denies nausea, heartburn or change in bowel habits Skin: Denies abnormal skin rashes Lymphatics: Denies new lymphadenopathy or easy bruising Neurological:Denies numbness, tingling or new weaknesses Behavioral/Psych: Mood is stable, no new changes  All other systems were reviewed with  the patient and are negative.  PHYSICAL EXAMINATION: ECOG PERFORMANCE STATUS: 0 - Asymptomatic  Filed Vitals:   08/03/13 1343  BP: 115/70  Pulse: 101  Temp: 97.5 F (36.4 C)  Resp: 20   Filed Weights   08/03/13 1343  Weight: 102 lb 12.8 oz (46.63 kg)    GENERAL:alert, no distress and comfortable. She appears thin and cachectic SKIN: skin color, texture, turgor are normal, no rashes or significant lesions EYES: normal,  Conjunctiva are pink and non-injected, sclera clear OROPHARYNX:no exudate, no erythema and lips, buccal mucosa, and tongue normal . Poor dentition is noted. No oral  Mucositis or thrush. NECK: supple, thyroid normal size, non-tender, without nodularity LYMPH:  no palpable lymphadenopathy in the cervical, axillary or inguinal LUNGS: clear to auscultation and percussion with normal breathing effort HEART: regular rate & rhythm and no murmurs and no lower extremity edema ABDOMEN:abdomen soft, non-tender and normal bowel sounds Musculoskeletal:no cyanosis of digits and no clubbing  NEURO: alert & oriented x 3 with fluent speech, no focal motor/sensory deficits  LABORATORY DATA:  I have reviewed the data as listed    Component Value Date/Time   NA 142 08/03/2013 1323   NA 140 03/16/2012 0808   K 3.7 08/03/2013 1323   K 3.5 03/16/2012 0808   CL 107 07/21/2012 0822   CL 106 03/16/2012 0808   CO2 24 08/03/2013 1323   CO2 27 03/16/2012 0808   GLUCOSE 104 08/03/2013 1323   GLUCOSE 100* 07/21/2012 0822   GLUCOSE 88 03/16/2012 0808   BUN 10.4 08/03/2013 1323   BUN 13 03/16/2012 0808   CREATININE 0.8 08/03/2013 1323   CREATININE 0.81 03/16/2012 0808   CALCIUM 9.6 08/03/2013 1323   CALCIUM 9.0 03/16/2012 0808   PROT 8.1 08/03/2013 1323   PROT 7.2 03/16/2012 0808   ALBUMIN 2.9* 08/03/2013 1323   ALBUMIN 3.4* 03/16/2012 0808   AST 15 08/03/2013 1323   AST 14 03/16/2012 0808   ALT <6 08/03/2013 1323   ALT 5 03/16/2012 0808   ALKPHOS 59 08/03/2013 1323   ALKPHOS 39 03/16/2012 0808   BILITOT 0.34 08/03/2013 1323   BILITOT 0.3 03/16/2012 0808   GFRNONAA 70* 03/16/2012 0808   GFRAA 82* 03/16/2012 0808    No results found for this basename: SPEP,  UPEP,   kappa and lambda light chains    Lab Results  Component Value Date   WBC 7.2 08/03/2013   NEUTROABS 4.8 08/03/2013   HGB 10.8* 08/03/2013   HCT 33.6* 08/03/2013   MCV 78.6* 08/03/2013   PLT 336 08/03/2013      Chemistry      Component Value  Date/Time   NA 142 08/03/2013 1323   NA 140 03/16/2012 0808   K 3.7 08/03/2013 1323   K 3.5 03/16/2012 0808   CL 107 07/21/2012 0822   CL 106 03/16/2012 0808   CO2 24 08/03/2013 1323   CO2 27 03/16/2012 0808   BUN 10.4 08/03/2013 1323   BUN 13 03/16/2012 0808   CREATININE 0.8 08/03/2013 1323   CREATININE 0.81 03/16/2012 0808      Component Value Date/Time   CALCIUM 9.6 08/03/2013 1323   CALCIUM 9.0 03/16/2012 0808   ALKPHOS 59 08/03/2013 1323   ALKPHOS 39 03/16/2012 0808   AST 15 08/03/2013 1323   AST 14 03/16/2012 0808   ALT <6 08/03/2013 1323   ALT 5 03/16/2012 0808   BILITOT 0.34 08/03/2013 1323   BILITOT 0.3 03/16/2012 0808     ASSESSMENT &  PLAN:  #1 piriform sinus squamous cell carcinoma  I am concerned about her progressive weight loss.  I want to proceed with imaging study with CT scan of the chest, abdomen and pelvis to rule out disease recurrence.  #2  progressive  weight loss with protein calorie malnutrition  I am concerned about her progressive weight loss. I will consult my dietitian to see her. As mentioned above, I will proceed with imaging study to rule out disease recurrence.  #3 hypothyroidism We will check a thyroid function test today and will call her if the results come back normal #4 anemia This is likely anemia of chronic disease. The patient denies recent history of bleeding such as epistaxis, hematuria or hematochezia. She is asymptomatic from the anemia. We will observe for now.  She does not require transfusion now.    Orders Placed This Encounter  Procedures  . CT Chest W Contrast    Standing Status: Future     Number of Occurrences:      Standing Expiration Date: 10/03/2014    Order Specific Question:  Reason for Exam (SYMPTOM  OR DIAGNOSIS REQUIRED)    Answer:  Hx pyriform sinus ca, weight loss, concern for recurrence of ca    Order Specific Question:  Preferred imaging location?    Answer:  [facility]  . CT Abdomen Pelvis W Contrast     Standing Status: Future     Number of Occurrences:      Standing Expiration Date: 11/03/2014    Order Specific Question:  Reason for Exam (SYMPTOM  OR DIAGNOSIS REQUIRED)    Answer:  Hx pyriform sinus ca, weight loss, concern for recurrence of ca    Order Specific Question:  Preferred imaging location?    Answer:  [facility]  . CT Soft Tissue Neck W Contrast    Standing Status: Future     Number of Occurrences:      Standing Expiration Date: 11/03/2014    Order Specific Question:  Reason for Exam (SYMPTOM  OR DIAGNOSIS REQUIRED)    Answer:  Hx pyriform sinus ca, weight loss, concern for recurrence of ca    Order Specific Question:  Preferred imaging location?    Answer:  [facility]   All questions were answered. The patient knows to call the clinic with any problems, questions or concerns. No barriers to learning was detected. I spent 25 minutes counseling the patient face to face. The total time spent in the appointment was 30 minutes and more than 50% was on counseling and review of test results     [facility], [place], MD 08/03/2013 4:00 PM

## 2013-08-04 ENCOUNTER — Telehealth: Payer: Self-pay | Admitting: Hematology and Oncology

## 2013-08-04 NOTE — Telephone Encounter (Signed)
, °

## 2013-08-11 ENCOUNTER — Ambulatory Visit (HOSPITAL_COMMUNITY)
Admission: RE | Admit: 2013-08-11 | Discharge: 2013-08-11 | Disposition: A | Discharge status: Home / Self Care | Payer: Medicare Other | Source: Ambulatory Visit | Attending: Hematology and Oncology | Admitting: Hematology and Oncology

## 2013-08-11 ENCOUNTER — Encounter (HOSPITAL_COMMUNITY): Payer: Self-pay

## 2013-08-11 DIAGNOSIS — K449 Diaphragmatic hernia without obstruction or gangrene: Secondary | ICD-10-CM | POA: Insufficient documentation

## 2013-08-11 DIAGNOSIS — R599 Enlarged lymph nodes, unspecified: Secondary | ICD-10-CM | POA: Insufficient documentation

## 2013-08-11 DIAGNOSIS — C12 Malignant neoplasm of pyriform sinus: Secondary | ICD-10-CM | POA: Insufficient documentation

## 2013-08-11 DIAGNOSIS — R222 Localized swelling, mass and lump, trunk: Secondary | ICD-10-CM | POA: Insufficient documentation

## 2013-08-11 DIAGNOSIS — R634 Abnormal weight loss: Secondary | ICD-10-CM

## 2013-08-11 MED ORDER — IOHEXOL 300 MG/ML  SOLN
100.0000 mL | Freq: Once | INTRAMUSCULAR | Status: AC | PRN
  Administered 2013-08-11: 100 mL via INTRAVENOUS

## 2013-08-12 ENCOUNTER — Ambulatory Visit: Payer: Medicare Other | Admitting: Nutrition

## 2013-08-12 NOTE — Progress Notes (Signed)
73 year old female with diagnosis of pyriform sinus cancer.  Status post radiation and chemotherapy.  Past medical history includes COPD, hyperlipidemia, hypothyroidism, diabetes, and hypopharynx cancer.  Medications include biotene, calcium with vitamin D, Crestor, Synthroid, and Januvia.  Labs include albumin 2.9, TSH 1.797.  On March 30.  Height: 68 inches. Weight: 101.4 pounds. Usual body weight 125 pounds. BMI: 15.42.  I met with patient and family member.  Patient reports poor appetite, dry mouth, and altered taste.  She enjoys resource breeze and is requesting samples and coupons for this product.  Dietary recall reveals patient is consuming two regular meals a day with one snack.  Patient is positive for 24 pound weight loss for 19% loss from usual body weight.  Patient meets criteria for severe malnutrition in the context of chronic illness secondary to severe depletion of fat and muscle stores on physical exam and 19% weight loss over the past 3 weeks.  Nutrition diagnosis: Under weight related to increased energy needs.  As evidenced by BMI of 15.42.  Intervention: Patient was educated to increase calories at mealtimes.  Provided specific examples of ways patient can add calories, without adding additional volume.  Encouraged patient to consume 3 meals daily with oral nutrition supplements between meals.  Provided education on nutrition side effects of poor appetite, dry mouth, and altered taste.  I provided patient with oral nutrition supplement samples, coupons, and recipes.  Questions were answered.  Teach back method used.  Monitoring, evaluation, goals: Patient will tolerate increased oral intake with oral nutrition supplements 3 times a day to minimize further weight loss.   Next visit: Patient's family member will call for followup appointment within 2-3 weeks.  Patient has meeting with physician early next week.

## 2013-08-17 ENCOUNTER — Telehealth: Payer: Self-pay | Admitting: Hematology and Oncology

## 2013-08-17 ENCOUNTER — Ambulatory Visit (HOSPITAL_BASED_OUTPATIENT_CLINIC_OR_DEPARTMENT_OTHER): Payer: Medicare Other | Admitting: Hematology and Oncology

## 2013-08-17 ENCOUNTER — Other Ambulatory Visit (HOSPITAL_COMMUNITY): Payer: Self-pay | Admitting: Dentistry

## 2013-08-17 VITALS — BP 118/54 | HR 76 | Temp 98.0°F | Resp 18 | Ht 68.0 in | Wt 104.2 lb

## 2013-08-17 DIAGNOSIS — R222 Localized swelling, mass and lump, trunk: Secondary | ICD-10-CM

## 2013-08-17 DIAGNOSIS — D649 Anemia, unspecified: Secondary | ICD-10-CM

## 2013-08-17 DIAGNOSIS — E46 Unspecified protein-calorie malnutrition: Secondary | ICD-10-CM

## 2013-08-17 DIAGNOSIS — J984 Other disorders of lung: Secondary | ICD-10-CM

## 2013-08-17 DIAGNOSIS — C12 Malignant neoplasm of pyriform sinus: Secondary | ICD-10-CM

## 2013-08-17 DIAGNOSIS — K007 Teething syndrome: Secondary | ICD-10-CM

## 2013-08-17 NOTE — Telephone Encounter (Signed)
Pt will call to schedule appt per MD

## 2013-08-17 NOTE — Progress Notes (Signed)
[place] OFFICE PROGRESS NOTE  Patient Care Team: Glendale Chard, MD as PCP - General (Internal Medicine) Glendale Chard, MD as Attending Physician (Internal Medicine) Lora Paula, MD (Radiation Oncology) Melida Quitter, MD (Otolaryngology) Nobie Putnam, MD (Hematology and Oncology)  DIAGNOSIS: Pyriform sinus cancer, new lung mass  SUMMARY OF ONCOLOGIC HISTORY:   Pyriform sinus cancer    I have reviewed her records extensively and collaborated the history with the patient. The patient was diagnosed with piriform sinus squamous cell carcinoma, clinical [id number]. She received concurrent chemotherapy with cisplatin every 3 weeks and daily radiation therapy. The radiation treatment was discontinued on 01/20/2009. Chemotherapy was discontinued on 12/20/2008. Her last CT scan in November 2013 was negative for disease recurrence INTERVAL HISTORY: Nancy Bates 73 y.o. female returns for evaluation. The patient has lost a little weight recently. I ordered imaging study of the chest, neck and abdomen. She also saw the dentist recently for poor dental health. She still had poor appetite. She has sensation of dry mouth and anorexia I have reviewed the past medical history, past surgical history, social history and family history with the patient and they are unchanged from previous note.  ALLERGIES:  is allergic to penicillins.  MEDICATIONS:  Current Outpatient Prescriptions  Medication Sig Dispense Refill  . albuterol (PROVENTIL,VENTOLIN) 90 MCG/ACT inhaler Inhale 2 puffs into the lungs every 4 (four) hours as needed. For shortness of breath      . antiseptic oral rinse (BIOTENE) LIQD 15 mLs by Mouth Rinse route every 2 (two) hours as needed. For dry mouth      . aspirin 81 MG tablet Take 81 mg by mouth daily.        Marland Kitchen CALCIUM-VITAMIN D PO Take 1 tablet by mouth daily.      . chlorhexidine (PERIDEX) 0.12 % solution Rinse with 15 mls twice daily for 30 seconds. Use after  breakfast and at bedtime. Spit out excess. Do not swallow.  480 mL  6  . Cholecalciferol (VITAMIN D3) 2000 UNITS capsule Take 2,000 Units by mouth daily.      . CRESTOR 10 MG tablet 10 mg daily.       Marland Kitchen levothyroxine (SYNTHROID) 50 MCG tablet Take 1 tablet (50 mcg total) by mouth daily before breakfast.  30 tablet  2  . loratadine (CLARITIN) 10 MG tablet Take 10 mg by mouth daily.        . sitaGLIPtin (JANUVIA) 100 MG tablet Take 100 mg by mouth daily.        . Sodium Fluoride (PREVIDENT 5000 DRY MOUTH) 1.1 % PSTE Place a thin ribbon of toothpaste onto tooth brush. Brush teeth at bedtime for 2 minutes. Spit out excess. Do not swallow. Do not rinse with water afterwards.  106 mL  one year  . tiotropium (SPIRIVA) 18 MCG inhalation capsule Place 18 mcg into inhaler and inhale daily.         No current facility-administered medications for this visit.    REVIEW OF SYSTEMS:   Constitutional: Denies fevers, chills Eyes: Denies blurriness of vision Ears, nose, mouth, throat, and face: Denies mucositis or sore throat All other systems were reviewed with the patient and are negative.  PHYSICAL EXAMINATION: ECOG PERFORMANCE STATUS: 1 - Symptomatic but completely ambulatory  Filed Vitals:   08/17/13 1426  BP: 118/54  Pulse: 76  Temp: 98 F (36.7 C)  Resp: 18   Filed Weights   08/17/13 1426  Weight: 104 lb 3.2 oz (47.265  kg)    GENERAL:alert, no distress and comfortable. She appears thin and cachectic SKIN: skin color, texture, turgor are normal, no rashes or significant lesions EYES: normal, Conjunctiva are pink and non-injected, sclera clear OROPHARYNX:no exudate, no erythema and lips, buccal mucosa, and tongue normal . Poor dentition NECK: supple, palpable mass behind the left sternocleidomastoid area NEURO: alert & oriented x 3 with fluent speech, no focal motor/sensory deficits  LABORATORY DATA:  I have reviewed the data as listed    Component Value Date/Time   NA 142 08/03/2013  1323   NA 140 03/16/2012 0808   K 3.7 08/03/2013 1323   K 3.5 03/16/2012 0808   CL 107 07/21/2012 0822   CL 106 03/16/2012 0808   CO2 24 08/03/2013 1323   CO2 27 03/16/2012 0808   GLUCOSE 104 08/03/2013 1323   GLUCOSE 100* 07/21/2012 0822   GLUCOSE 88 03/16/2012 0808   BUN 10.4 08/03/2013 1323   BUN 13 03/16/2012 0808   CREATININE 0.8 08/03/2013 1323   CREATININE 0.81 03/16/2012 0808   CALCIUM 9.6 08/03/2013 1323   CALCIUM 9.0 03/16/2012 0808   PROT 8.1 08/03/2013 1323   PROT 7.2 03/16/2012 0808   ALBUMIN 2.9* 08/03/2013 1323   ALBUMIN 3.4* 03/16/2012 0808   AST 15 08/03/2013 1323   AST 14 03/16/2012 0808   ALT <6 08/03/2013 1323   ALT 5 03/16/2012 0808   ALKPHOS 59 08/03/2013 1323   ALKPHOS 39 03/16/2012 0808   BILITOT 0.34 08/03/2013 1323   BILITOT 0.3 03/16/2012 0808   GFRNONAA 70* 03/16/2012 0808   GFRAA 82* 03/16/2012 0808    No results found for this basename: SPEP, UPEP,  kappa and lambda light chains    Lab Results  Component Value Date   WBC 7.2 08/03/2013   NEUTROABS 4.8 08/03/2013   HGB 10.8* 08/03/2013   HCT 33.6* 08/03/2013   MCV 78.6* 08/03/2013   PLT 336 08/03/2013      Chemistry      Component Value Date/Time   NA 142 08/03/2013 1323   NA 140 03/16/2012 0808   K 3.7 08/03/2013 1323   K 3.5 03/16/2012 0808   CL 107 07/21/2012 0822   CL 106 03/16/2012 0808   CO2 24 08/03/2013 1323   CO2 27 03/16/2012 0808   BUN 10.4 08/03/2013 1323   BUN 13 03/16/2012 0808   CREATININE 0.8 08/03/2013 1323   CREATININE 0.81 03/16/2012 0808      Component Value Date/Time   CALCIUM 9.6 08/03/2013 1323   CALCIUM 9.0 03/16/2012 0808   ALKPHOS 59 08/03/2013 1323   ALKPHOS 39 03/16/2012 0808   AST 15 08/03/2013 1323   AST 14 03/16/2012 0808   ALT <6 08/03/2013 1323   ALT 5 03/16/2012 0808   BILITOT 0.34 08/03/2013 1323   BILITOT 0.3 03/16/2012 0808       RADIOGRAPHIC STUDIES: I reviewed the CT scans with her and her daughter I have personally reviewed the radiological images as  listed and agreed with the findings in the report.  ASSESSMENT & PLAN:  #1 Pyriform sinus cancer #2 left lung mass I suspect this is due to recurrence of disease. The patient was a smoker and it is possible the new lung mass could be a primary lung cancer. The cancer is very extensive I do not think the patient is a candidate for concurrent chemoradiation treatment due to poor performance status and cancer cachexia. Bottom line is, I think she needs a biopsy. I will order  a CT-guided biopsy and present her case at the next ENT tumor board. #3 poor dentition I have consulted the dentist to perform dental extraction in anticipation for cancer treatment in the near future #4 moderate 14-calorie malnutrition She will continue to followup with the nutritionist #5 anemia This is likely anemia of chronic disease. The patient denies recent history of bleeding such as epistaxis, hematuria or hematochezia. She is asymptomatic from the anemia. We will observe for now.  She does not require transfusion now.    Orders Placed This Encounter  Procedures  . CT Biopsy    Standing Status: Future     Number of Occurrences:      Standing Expiration Date: 08/17/2014    Order Specific Question:  Reason for Exam (SYMPTOM  OR DIAGNOSIS REQUIRED)    Answer:  left lung biopsy, Hx pyriform sinus cancer    Order Specific Question:  Preferred imaging location?    Answer:  [facility]   All questions were answered. The patient knows to call the clinic with any problems, questions or concerns. No barriers to learning was detected. I spent 40 minutes counseling the patient face to face. The total time spent in the appointment was 55 minutes and more than 50% was on counseling and review of test results     Heath Lark, MD 08/17/2013 3:30 PM

## 2013-08-18 ENCOUNTER — Encounter (HOSPITAL_COMMUNITY): Payer: Self-pay | Admitting: Pharmacy Technician

## 2013-08-19 ENCOUNTER — Other Ambulatory Visit: Payer: Self-pay | Admitting: Hematology and Oncology

## 2013-08-19 DIAGNOSIS — C12 Malignant neoplasm of pyriform sinus: Secondary | ICD-10-CM

## 2013-08-19 DIAGNOSIS — R918 Other nonspecific abnormal finding of lung field: Secondary | ICD-10-CM

## 2013-08-20 ENCOUNTER — Telehealth: Payer: Self-pay | Admitting: Hematology and Oncology

## 2013-08-20 ENCOUNTER — Other Ambulatory Visit: Payer: Self-pay | Admitting: Radiology

## 2013-08-20 ENCOUNTER — Telehealth: Payer: Self-pay | Admitting: *Deleted

## 2013-08-20 NOTE — Telephone Encounter (Signed)
Left VM for daughter requesting she call nurse back.

## 2013-08-20 NOTE — Telephone Encounter (Signed)
, °

## 2013-08-20 NOTE — Telephone Encounter (Signed)
Informed dau of MRI brain and PET scan ordered to help direct treatment. Expect call from [place]. She verbalized understanding.

## 2013-08-20 NOTE — Telephone Encounter (Signed)
Message copied by Cathlean Cower on Thu Aug 20, 2013  8:40 AM ------      Message from: [facility], [place]      Created: Wed Aug 19, 2013  9:04 PM      Regarding: new scans ordered       C[place],            Dr. Tammi Klippel requested we order additional imaging. I ordered MRI and PET. Can you please explain to the patient that it is necessary in order to treat?      Thanks ------

## 2013-08-24 ENCOUNTER — Ambulatory Visit (HOSPITAL_COMMUNITY)
Admission: RE | Admit: 2013-08-24 | Discharge: 2013-08-24 | Disposition: A | Discharge status: Home / Self Care | Payer: Medicare Other | Source: Ambulatory Visit | Attending: Hematology and Oncology | Admitting: Hematology and Oncology

## 2013-08-24 ENCOUNTER — Other Ambulatory Visit: Payer: Self-pay | Admitting: Hematology and Oncology

## 2013-08-24 ENCOUNTER — Ambulatory Visit (HOSPITAL_COMMUNITY)
Admission: RE | Admit: 2013-08-24 | Discharge: 2013-08-24 | Disposition: A | Discharge status: Home / Self Care | Payer: Medicare Other | Source: Ambulatory Visit | Attending: Diagnostic Radiology | Admitting: Diagnostic Radiology

## 2013-08-24 DIAGNOSIS — C12 Malignant neoplasm of pyriform sinus: Secondary | ICD-10-CM

## 2013-08-24 DIAGNOSIS — R222 Localized swelling, mass and lump, trunk: Secondary | ICD-10-CM | POA: Insufficient documentation

## 2013-08-24 DIAGNOSIS — J984 Other disorders of lung: Secondary | ICD-10-CM

## 2013-08-24 DIAGNOSIS — IMO0002 Reserved for concepts with insufficient information to code with codable children: Secondary | ICD-10-CM

## 2013-08-24 HISTORY — DX: Reserved for concepts with insufficient information to code with codable children: IMO0002

## 2013-08-24 HISTORY — PX: LUNG BIOPSY: SHX232

## 2013-08-24 LAB — PROTIME-INR
INR: 1 (ref 0.00–1.49)
Prothrombin Time: 13 seconds (ref 11.6–15.2)

## 2013-08-24 LAB — CBC
HCT: 32.4 % — ABNORMAL LOW (ref 36.0–46.0)
HEMOGLOBIN: 10.9 g/dL — AB (ref 12.0–15.0)
MCH: 25.7 pg — AB (ref 26.0–34.0)
MCHC: 33.6 g/dL (ref 30.0–36.0)
MCV: 76.4 fL — ABNORMAL LOW (ref 78.0–100.0)
PLATELETS: 363 10*3/uL (ref 150–400)
RBC: 4.24 MIL/uL (ref 3.87–5.11)
RDW: 16.9 % — ABNORMAL HIGH (ref 11.5–15.5)
WBC: 8.1 10*3/uL (ref 4.0–10.5)

## 2013-08-24 LAB — GLUCOSE, CAPILLARY: Glucose-Capillary: 93 mg/dL (ref 70–99)

## 2013-08-24 LAB — APTT: aPTT: 29 seconds (ref 24–37)

## 2013-08-24 MED ORDER — SODIUM CHLORIDE 0.9 % IV SOLN: INTRAVENOUS | Status: DC

## 2013-08-24 MED ORDER — MIDAZOLAM HCL 2 MG/2ML IJ SOLN
INTRAMUSCULAR | Status: AC
  Filled 2013-08-24: qty 4

## 2013-08-24 MED ORDER — FENTANYL CITRATE 0.05 MG/ML IJ SOLN
INTRAMUSCULAR | Status: AC | PRN
  Administered 2013-08-24 (×2): 25 ug via INTRAVENOUS

## 2013-08-24 MED ORDER — SODIUM CHLORIDE 0.9 % IV SOLN
INTRAVENOUS | Status: AC | PRN
  Administered 2013-08-24: 30 mL/h via INTRAVENOUS

## 2013-08-24 MED ORDER — FENTANYL CITRATE 0.05 MG/ML IJ SOLN
INTRAMUSCULAR | Status: AC
  Filled 2013-08-24: qty 4

## 2013-08-24 MED ORDER — MIDAZOLAM HCL 2 MG/2ML IJ SOLN
INTRAMUSCULAR | Status: AC | PRN
  Administered 2013-08-24 (×2): 0.5 mg via INTRAVENOUS

## 2013-08-24 MED ORDER — LIDOCAINE HCL 1 % IJ SOLN
INTRAMUSCULAR | Status: AC
  Filled 2013-08-24: qty 10

## 2013-08-24 NOTE — H&P (Signed)
Nancy Bates is an 73 y.o. female.   Chief Complaint: " I'm having a lung biopsy" HPI: Patient with history of pyriform sinus squamous cell carcinoma 2010, prior smoker,anemia, weight loss and recent CT revealing a large left suprahilar upper lobe mass/associated mediastinal adenopathy presents today for CT guided biopsy of the left lung mass.  Past Medical History  Diagnosis Date  . COPD (chronic obstructive pulmonary disease)   . Hyperlipidemia   . Hypothyroidism   . Diabetes mellitus   . 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 Status post chemotherapy comp. 12/20/08    Cisplatin q 3 weeks Concurrent with Radiation  Therapy  . 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 Cancer     sinus and neck ca  . Microcytosis 03/24/2013  . Weight loss 08/03/2013    Past Surgical History  Procedure Laterality Date  . Hiatal hernia repair    . Right ankle      S/p Fracture  . Direct laryngoscopy  07/07/08  . Debulking  07/07/08    Laser Debulking during direct Laryngoscopy  - Squmaous cell of the Pyriform Sinus     Family History  Problem Relation Age of Onset  . Stroke Mother   . Hypertension Mother   . Cancer Sister     stomach  . Diabetes Sister    Social History:  reports that she quit smoking about 12 years ago. Her smoking use included Cigarettes. She has a 150 pack-year smoking history. She has never used smokeless tobacco. She reports that she does not use illicit drugs. Her alcohol history is not on file.  Allergies:  Allergies  Allergen Reactions  . Penicillins Other (See Comments)    unknown    Current outpatient prescriptions:albuterol (PROVENTIL,VENTOLIN) 90 MCG/ACT inhaler, Inhale 2 puffs into the lungs every 4 (four) hours as needed. For shortness of breath, Disp: , Rfl: ;  antiseptic oral rinse (BIOTENE) LIQD, 15 mLs by Mouth Rinse route every 2 (two) hours as needed. For dry  mouth, Disp: , Rfl: ;  aspirin 81 MG tablet, Take 81 mg by mouth daily.  , Disp: , Rfl: ;  CALCIUM-VITAMIN D PO, Take 1 tablet by mouth daily., Disp: , Rfl:  chlorhexidine (PERIDEX) 0.12 % solution, Rinse with 15 mls twice daily for 30 seconds. Use after breakfast and at bedtime. Spit out excess. Do not swallow., Disp: 480 mL, Rfl: 6;  Cholecalciferol (VITAMIN D3) 2000 UNITS capsule, Take 2,000 Units by mouth daily., Disp: , Rfl: ;  CRESTOR 10 MG tablet, 10 mg daily. , Disp: , Rfl:  levothyroxine (SYNTHROID) 50 MCG tablet, Take 1 tablet (50 mcg total) by mouth daily before breakfast., Disp: 30 tablet, Rfl: 2;  loratadine (CLARITIN) 10 MG tablet, Take 10 mg by mouth daily.  , Disp: , Rfl: ;  sitaGLIPtin (JANUVIA) 100 MG tablet, Take 100 mg by mouth daily.  , Disp: , Rfl:  Sodium Fluoride (PREVIDENT 5000 DRY MOUTH) 1.1 % PSTE, Place a thin ribbon of toothpaste onto tooth brush. Brush teeth at bedtime for 2 minutes. Spit out excess. Do not swallow. Do not rinse with water afterwards., Disp: 106 mL, Rfl: one year;  tiotropium (SPIRIVA) 18 MCG inhalation capsule, Place 18 mcg into inhaler and inhale daily.  , Disp: , Rfl:  Results for orders placed during the hospital encounter of 08/24/13  GLUCOSE, CAPILLARY  Result Value Ref Range   Glucose-Capillary 93  70 - 99 mg/dL   Comment 1 Notify RN     Comment 2 Documented in Chart       Results for orders placed during the hospital encounter of 08/24/13 (from the past 48 hour(s))  GLUCOSE, CAPILLARY     Status: None   Collection Time    08/24/13  9:44 AM      Result Value Ref Range   Glucose-Capillary 93  70 - 99 mg/dL   Comment 1 Notify RN     Comment 2 Documented in Chart     08/24/13 labs pending Review of Systems  Constitutional: Positive for weight loss and malaise/fatigue. Negative for fever and chills.       Anorexia  Respiratory: Positive for cough and shortness of breath. Negative for hemoptysis.   Cardiovascular: Negative for chest pain.   Gastrointestinal: Negative for nausea, vomiting and abdominal pain.  Musculoskeletal: Negative for back pain.  Neurological: Negative for headaches.  Endo/Heme/Allergies: Does not bruise/bleed easily.    Blood pressure 110/61, pulse 85, temperature 98.5 F (36.9 C), temperature source Oral, resp. rate 18, height 5\' 7"  (1.702 m), weight 104 lb (47.174 kg), SpO2 99.00%. Physical Exam  Constitutional: She is oriented to person, place, and time.  Thin, cachetic BF in NAD  Neck:  Left supraclavicular fullness, tender to palpation  Cardiovascular:  Sinus with occ ectopy  Respiratory: Effort normal and breath sounds normal.  GI: Soft. Bowel sounds are normal. There is no tenderness.  Musculoskeletal: Normal range of motion. She exhibits no edema.  Neurological: She is alert and oriented to person, place, and time.     Assessment/Plan Patient with history of pyriform sinus squamous cell carcinoma 2010, prior smoker,anemia, weight loss and recent CT revealing a large left suprahilar upper lobe mass/associated mediastinal adenopathy presents today for CT guided biopsy of the left lung mass. Details/risks of procedure d/w pt/daughter with their understanding and consent.   Crissie Sickles Jermany Rimel 08/24/2013, 9:55 AM

## 2013-08-24 NOTE — Discharge Instructions (Signed)
Needle Biopsy of Lung, Care After Refer to this sheet in the next few weeks. These instructions provide you with information on caring for yourself after your procedure. Your health care provider may also give you more specific instructions. Your treatment has been planned according to current medical practices, but problems sometimes occur. Call your health care provider if you have any problems or questions after your procedure. WHAT TO EXPECT AFTER THE PROCEDURE A bandage will be applied over the areas where the needle was inserted. You may be asked to apply pressure to the bandage for several minutes to ensure there is minimal bleeding. In most cases, you can leave when your needle biopsy procedure is completed. Do not drive yourself home. Someone else should take you home. If you received an IV sedative or general anesthetic, you will be taken to a comfortable place to relax while the medication wears off. If you have upcoming travel scheduled, talk to your doctor about when it is safe to travel by air after the procedure. HOME CARE INSTRUCTIONS Expect to take it easy for the rest of the day. Protect the area where you received the needle biopsy by keeping the bandage in place for as long as instructed. You may feel some mild pain or discomfort in the area, but this should stop in a day or two. Only take over-the-counter or prescription medicines for pain, discomfort, or fever as directed by your caregiver. SEEK MEDICAL CARE IF:   You have pain at the biopsy site that worsens or is not helped by medication.  You have swelling or drainage at the needle biopsy site.  You have a fever. SEEK IMMEDIATE MEDICAL CARE IF:   You have new or worsening shortness of breath.  You have chest pain.  You are coughing up blood.  You have bleeding that does not stop with pressure or a bandage.  You develop light-headedness or fainting. Document Released: 02/18/2007 Document Revised: 12/24/2012 Document  Reviewed: 09/15/2012 [facility] Patient Information 2014 [place].

## 2013-08-24 NOTE — Progress Notes (Signed)
PER DR HENN CXR OK;OK  TO D/C HOME

## 2013-08-24 NOTE — Sedation Documentation (Signed)
Denies pain, no SOB, no hemoptysis.  Tolerated procedure well.

## 2013-08-24 NOTE — Sedation Documentation (Signed)
O2 d/c'd 

## 2013-08-24 NOTE — Procedures (Signed)
CT guided core biopsies of left lung mass.  2 cores obtained.  No immediate complication.

## 2013-08-25 ENCOUNTER — Encounter (HOSPITAL_COMMUNITY)
Admission: RE | Admit: 2013-08-25 | Discharge: 2013-08-25 | Disposition: A | Discharge status: Home / Self Care | Payer: Medicare Other | Source: Ambulatory Visit | Attending: Dentistry | Admitting: Dentistry

## 2013-08-25 ENCOUNTER — Other Ambulatory Visit: Payer: Self-pay | Admitting: Hematology and Oncology

## 2013-08-25 ENCOUNTER — Telehealth: Payer: Self-pay | Admitting: Hematology and Oncology

## 2013-08-25 ENCOUNTER — Encounter (HOSPITAL_COMMUNITY): Payer: Self-pay

## 2013-08-25 DIAGNOSIS — Z85819 Personal history of malignant neoplasm of unspecified site of lip, oral cavity, and pharynx: Secondary | ICD-10-CM | POA: Diagnosis not present

## 2013-08-25 DIAGNOSIS — I509 Heart failure, unspecified: Secondary | ICD-10-CM | POA: Diagnosis not present

## 2013-08-25 DIAGNOSIS — K083 Retained dental root: Secondary | ICD-10-CM | POA: Diagnosis not present

## 2013-08-25 DIAGNOSIS — Z9221 Personal history of antineoplastic chemotherapy: Secondary | ICD-10-CM | POA: Diagnosis not present

## 2013-08-25 DIAGNOSIS — Z01810 Encounter for preprocedural cardiovascular examination: Secondary | ICD-10-CM | POA: Diagnosis not present

## 2013-08-25 DIAGNOSIS — E119 Type 2 diabetes mellitus without complications: Secondary | ICD-10-CM | POA: Diagnosis not present

## 2013-08-25 DIAGNOSIS — K0530 Chronic periodontitis, unspecified: Secondary | ICD-10-CM | POA: Diagnosis not present

## 2013-08-25 DIAGNOSIS — Z7982 Long term (current) use of aspirin: Secondary | ICD-10-CM | POA: Diagnosis not present

## 2013-08-25 DIAGNOSIS — Z923 Personal history of irradiation: Secondary | ICD-10-CM | POA: Diagnosis not present

## 2013-08-25 DIAGNOSIS — K117 Disturbances of salivary secretion: Secondary | ICD-10-CM | POA: Diagnosis not present

## 2013-08-25 DIAGNOSIS — C3490 Malignant neoplasm of unspecified part of unspecified bronchus or lung: Secondary | ICD-10-CM | POA: Diagnosis not present

## 2013-08-25 DIAGNOSIS — J449 Chronic obstructive pulmonary disease, unspecified: Secondary | ICD-10-CM | POA: Diagnosis not present

## 2013-08-25 DIAGNOSIS — I252 Old myocardial infarction: Secondary | ICD-10-CM | POA: Diagnosis not present

## 2013-08-25 DIAGNOSIS — Z01812 Encounter for preprocedural laboratory examination: Secondary | ICD-10-CM | POA: Diagnosis not present

## 2013-08-25 DIAGNOSIS — K029 Dental caries, unspecified: Secondary | ICD-10-CM | POA: Diagnosis present

## 2013-08-25 DIAGNOSIS — Z880 Allergy status to penicillin: Secondary | ICD-10-CM | POA: Diagnosis not present

## 2013-08-25 DIAGNOSIS — K219 Gastro-esophageal reflux disease without esophagitis: Secondary | ICD-10-CM | POA: Diagnosis not present

## 2013-08-25 DIAGNOSIS — Z87891 Personal history of nicotine dependence: Secondary | ICD-10-CM | POA: Diagnosis not present

## 2013-08-25 LAB — BASIC METABOLIC PANEL
BUN: 14 mg/dL (ref 6–23)
CHLORIDE: 103 meq/L (ref 96–112)
CO2: 23 mEq/L (ref 19–32)
Calcium: 9.8 mg/dL (ref 8.4–10.5)
Creatinine, Ser: 0.69 mg/dL (ref 0.50–1.10)
GFR, EST NON AFRICAN AMERICAN: 83 mL/min — AB (ref >90)
Glucose, Bld: 81 mg/dL (ref 70–99)
POTASSIUM: 3.6 meq/L — AB (ref 3.7–5.3)
Sodium: 138 mEq/L (ref 137–147)

## 2013-08-25 LAB — CBC
HEMATOCRIT: 33 % — AB (ref 36.0–46.0)
HEMOGLOBIN: 10.7 g/dL — AB (ref 12.0–15.0)
MCH: 24.8 pg — ABNORMAL LOW (ref 26.0–34.0)
MCHC: 32.4 g/dL (ref 30.0–36.0)
MCV: 76.6 fL — ABNORMAL LOW (ref 78.0–100.0)
Platelets: 355 10*3/uL (ref 150–400)
RBC: 4.31 MIL/uL (ref 3.87–5.11)
RDW: 16.8 % — ABNORMAL HIGH (ref 11.5–15.5)
WBC: 7.5 10*3/uL (ref 4.0–10.5)

## 2013-08-25 NOTE — Telephone Encounter (Signed)
s.w. pt daughter and advised on May appt....pt daughter requested different day from MD request due to pt having procedure on 4.30.15

## 2013-08-25 NOTE — Progress Notes (Signed)
Anesthesia Note:  Patient is a 73 year old female scheduled for multiple teeth extractions on 08/27/13 by Dr. Enrique Sack. She has a history of pyriform cancer in 2010 and was recently found to have a left lung mass.  Her oncologist referred her for dental extraction with poor dentition in anticipation for cancer treatment in the near future.  History includes former smoker, COPD, HLD, hypothyroidism, DM2, hiatal hernia repair, left pyriform sinus cancer s/p laser debulking during laryngoscopy 07/2008 and chemoradiation completed 01/2009, microcytosis, anemia. BMI is 16.25. Pathology from left lung biopsy done on 08/24/13 showed invasive squamous cell carcinoma. (I'm not sure that she has been told of the results yet.) Oncologist is Dr. Alvy Bimler. PCP is Dr. Glendale Chard.    Chest CT on 08/11/13 showed: Large left suprahilar upper lobe mass medially extending from the left superior hilum to the left apex with areas of necrosis. This measures 5.9 x 4.1 cm on axial image 20. This is most compatible of primary bronchogenic carcinoma. Associated mediastinal adenopathy. No acute findings or evidence of metastatic disease in the abdomen or pelvis. 1V CXR on 08/24/13 showed: Stable left upper lobe mass lesion with associated pleural thickening.  EKG on 08/25/13 showed SR with PACs.  Preoperative labs noted.   She presents with her daughter today.  She has had a dry mouth since undergoing chemoradiation in 2010, but denies dysphagia, limited mouth opening, or new SOB.  She has chronic DOE which has been present since before 2010.  She denies chest pain and LE edema.  She lives at home, but sleeps in a hospital bed with HOB up 20-30 degrees.  Her ASA is on hold for surgery.  Her DM has been well controlled.  On exam, heart RRR, no murmur noted.  Lungs clear.  No LE edema noted.    I did discuss that procedure is currently posted for GA and that sometimes a nasal intubation is required for oral surgery.  Because she has a  history of radiation to the neck it is possible that a smaller tube or advanced airway equipment (ie glidescope, etc.) may be needed.  She and her daughter feel comfortable talking with her assigned anesthesiologist further on the day of surgery to discuss the definitive anesthesia plan.  George Hugh [facility]/Anesthesiology Phone [phone] 08/25/2013 5:20 PM

## 2013-08-25 NOTE — Pre-Procedure Instructions (Signed)
Nancy Bates  08/25/2013   Your procedure is scheduled on:  08-27-2013  Thursday   Report to [facility]  2 * 3 at 7:00 AM.   Call this number if you have problems the morning of surgery: [phone]    Remember:   Do not eat food or drink liquids after midnight.    Take these medicines the morning of surgery with A SIP OF WATER: albuterol inhaler as needed,levothyroxine(Synthroid),loratadine(Claritin),spiriva inhaler               NO DIABETES MEDICATION THE MORNING OF SURGERY      Do not wear jewelry, make-up or nail polish.  Do not wear lotions, powders, or perfumes.   Do not shave 48 hours prior to surgery.                    Contacts, dentures or bridgework may not be worn into surgery.  Leave suitcase in the car. After surgery it may be brought to your room.  For patients admitted to the hospital, discharge time is determined by your  treatment team.               Patients discharged the day of surgery will not be allowed to drive home.   Name and phone number of your driver:    Special Instructions: See attached sheet for instructions on CHG Shower   Please read over the following fact sheets that you were given: Pain Booklet and Surgical Site Infection Prevention

## 2013-08-25 NOTE — Progress Notes (Signed)
Verified with Allsion Golden Pop PA that we do not need 2 view CXR  the patient has 1 view done on 08-24-2013 snf CT chest on 08-11-2013.

## 2013-08-26 MED ORDER — CLINDAMYCIN PHOSPHATE 600 MG/50ML IV SOLN
600.0000 mg | Freq: Once | INTRAVENOUS | Status: DC
  Filled 2013-08-26: qty 50

## 2013-08-27 ENCOUNTER — Encounter (HOSPITAL_COMMUNITY): Payer: Self-pay | Admitting: Surgery

## 2013-08-27 ENCOUNTER — Encounter (HOSPITAL_COMMUNITY)
Admission: RE | Disposition: A | Discharge status: Home / Self Care | Payer: Self-pay | Source: Ambulatory Visit | Attending: Dentistry

## 2013-08-27 ENCOUNTER — Ambulatory Visit (HOSPITAL_COMMUNITY): Payer: Medicare Other | Admitting: Anesthesiology

## 2013-08-27 ENCOUNTER — Encounter (HOSPITAL_COMMUNITY): Payer: Medicare Other | Admitting: Vascular Surgery

## 2013-08-27 ENCOUNTER — Ambulatory Visit (HOSPITAL_COMMUNITY)
Admission: RE | Admit: 2013-08-27 | Discharge: 2013-08-27 | Disposition: A | Discharge status: Home / Self Care | Payer: Medicare Other | Source: Ambulatory Visit | Attending: Dentistry | Admitting: Dentistry

## 2013-08-27 DIAGNOSIS — K117 Disturbances of salivary secretion: Secondary | ICD-10-CM | POA: Insufficient documentation

## 2013-08-27 DIAGNOSIS — Z923 Personal history of irradiation: Secondary | ICD-10-CM | POA: Insufficient documentation

## 2013-08-27 DIAGNOSIS — K0530 Chronic periodontitis, unspecified: Secondary | ICD-10-CM | POA: Insufficient documentation

## 2013-08-27 DIAGNOSIS — Z9221 Personal history of antineoplastic chemotherapy: Secondary | ICD-10-CM | POA: Insufficient documentation

## 2013-08-27 DIAGNOSIS — K219 Gastro-esophageal reflux disease without esophagitis: Secondary | ICD-10-CM | POA: Insufficient documentation

## 2013-08-27 DIAGNOSIS — K029 Dental caries, unspecified: Secondary | ICD-10-CM

## 2013-08-27 DIAGNOSIS — K045 Chronic apical periodontitis: Secondary | ICD-10-CM

## 2013-08-27 DIAGNOSIS — C3490 Malignant neoplasm of unspecified part of unspecified bronchus or lung: Secondary | ICD-10-CM | POA: Insufficient documentation

## 2013-08-27 DIAGNOSIS — I509 Heart failure, unspecified: Secondary | ICD-10-CM | POA: Insufficient documentation

## 2013-08-27 DIAGNOSIS — Z01812 Encounter for preprocedural laboratory examination: Secondary | ICD-10-CM | POA: Insufficient documentation

## 2013-08-27 DIAGNOSIS — E119 Type 2 diabetes mellitus without complications: Secondary | ICD-10-CM | POA: Insufficient documentation

## 2013-08-27 DIAGNOSIS — J449 Chronic obstructive pulmonary disease, unspecified: Secondary | ICD-10-CM | POA: Insufficient documentation

## 2013-08-27 DIAGNOSIS — K083 Retained dental root: Secondary | ICD-10-CM

## 2013-08-27 DIAGNOSIS — Z85819 Personal history of malignant neoplasm of unspecified site of lip, oral cavity, and pharynx: Secondary | ICD-10-CM | POA: Insufficient documentation

## 2013-08-27 DIAGNOSIS — Z7982 Long term (current) use of aspirin: Secondary | ICD-10-CM | POA: Insufficient documentation

## 2013-08-27 DIAGNOSIS — I252 Old myocardial infarction: Secondary | ICD-10-CM | POA: Insufficient documentation

## 2013-08-27 DIAGNOSIS — Z87891 Personal history of nicotine dependence: Secondary | ICD-10-CM | POA: Insufficient documentation

## 2013-08-27 DIAGNOSIS — J4489 Other specified chronic obstructive pulmonary disease: Secondary | ICD-10-CM | POA: Insufficient documentation

## 2013-08-27 DIAGNOSIS — Z01810 Encounter for preprocedural cardiovascular examination: Secondary | ICD-10-CM | POA: Insufficient documentation

## 2013-08-27 DIAGNOSIS — Z880 Allergy status to penicillin: Secondary | ICD-10-CM | POA: Insufficient documentation

## 2013-08-27 HISTORY — PX: MULTIPLE EXTRACTIONS WITH ALVEOLOPLASTY: SHX5342

## 2013-08-27 LAB — GLUCOSE, CAPILLARY
Glucose-Capillary: 87 mg/dL (ref 70–99)
Glucose-Capillary: 97 mg/dL (ref 70–99)

## 2013-08-27 SURGERY — MULTIPLE EXTRACTION WITH ALVEOLOPLASTY
Anesthesia: General | Site: Mouth

## 2013-08-27 MED ORDER — 0.9 % SODIUM CHLORIDE (POUR BTL) OPTIME
TOPICAL | Status: DC | PRN
  Administered 2013-08-27: 1000 mL

## 2013-08-27 MED ORDER — ALCOHOL (RUBBING) 70 % SOLN
Status: DC | PRN
  Administered 2013-08-27: 50 mL via TOPICAL

## 2013-08-27 MED ORDER — EPHEDRINE SULFATE 50 MG/ML IJ SOLN
INTRAMUSCULAR | Status: AC
  Filled 2013-08-27: qty 1

## 2013-08-27 MED ORDER — OXYMETAZOLINE HCL 0.05 % NA SOLN
NASAL | Status: AC
  Filled 2013-08-27: qty 15

## 2013-08-27 MED ORDER — OXYCODONE HCL 5 MG/5ML PO SOLN: 5.0000 mg | Freq: Once | ORAL | Status: DC | PRN

## 2013-08-27 MED ORDER — FENTANYL CITRATE 0.05 MG/ML IJ SOLN: 25.0000 ug | INTRAMUSCULAR | Status: DC | PRN

## 2013-08-27 MED ORDER — ARTIFICIAL TEARS OP OINT
TOPICAL_OINTMENT | OPHTHALMIC | Status: DC | PRN
  Administered 2013-08-27: 1 via OPHTHALMIC

## 2013-08-27 MED ORDER — GLYCOPYRROLATE 0.2 MG/ML IJ SOLN
INTRAMUSCULAR | Status: DC | PRN
Start: 2013-08-27 — End: 2013-08-27
  Administered 2013-08-27: 0.2 mg via INTRAVENOUS

## 2013-08-27 MED ORDER — PROPOFOL 10 MG/ML IV BOLUS
INTRAVENOUS | Status: AC
  Filled 2013-08-27: qty 20

## 2013-08-27 MED ORDER — ACETAMINOPHEN 160 MG/5ML PO SOLN
325.0000 mg | ORAL | Status: DC | PRN
  Filled 2013-08-27: qty 20.3

## 2013-08-27 MED ORDER — WATER FOR IRRIGATION, STERILE IR SOLN
Status: DC | PRN
  Administered 2013-08-27: 1000 mL

## 2013-08-27 MED ORDER — LACTATED RINGERS IV SOLN
INTRAVENOUS | Status: DC
  Administered 2013-08-27: 08:00:00 via INTRAVENOUS

## 2013-08-27 MED ORDER — ONDANSETRON HCL 4 MG/2ML IJ SOLN: 4.0000 mg | Freq: Once | INTRAMUSCULAR | Status: DC | PRN

## 2013-08-27 MED ORDER — ONDANSETRON HCL 4 MG/2ML IJ SOLN
INTRAMUSCULAR | Status: AC
  Filled 2013-08-27: qty 2

## 2013-08-27 MED ORDER — HYDROCODONE-ACETAMINOPHEN 5-325 MG PO TABS: 1.0000 | ORAL_TABLET | Freq: Four times a day (QID) | ORAL | Status: AC | PRN

## 2013-08-27 MED ORDER — LACTATED RINGERS IV SOLN
INTRAVENOUS | Status: DC | PRN
  Administered 2013-08-27: 09:00:00 via INTRAVENOUS

## 2013-08-27 MED ORDER — LIDOCAINE-EPINEPHRINE 2 %-1:100000 IJ SOLN
INTRAMUSCULAR | Status: AC
  Filled 2013-08-27: qty 1.7

## 2013-08-27 MED ORDER — ONDANSETRON HCL 4 MG/2ML IJ SOLN
INTRAMUSCULAR | Status: DC | PRN
  Administered 2013-08-27: 4 mg via INTRAVENOUS

## 2013-08-27 MED ORDER — LIDOCAINE HCL (CARDIAC) 20 MG/ML IV SOLN
INTRAVENOUS | Status: DC | PRN
  Administered 2013-08-27: 50 mg via INTRAVENOUS

## 2013-08-27 MED ORDER — BUPIVACAINE-EPINEPHRINE (PF) 0.5% -1:200000 IJ SOLN
INTRAMUSCULAR | Status: AC
  Filled 2013-08-27: qty 3.6

## 2013-08-27 MED ORDER — CLINDAMYCIN PHOSPHATE 600 MG/50ML IV SOLN
INTRAVENOUS | Status: DC | PRN
  Administered 2013-08-27: 600 mg via INTRAVENOUS

## 2013-08-27 MED ORDER — BUPIVACAINE-EPINEPHRINE 0.5% -1:200000 IJ SOLN
INTRAMUSCULAR | Status: DC | PRN
  Administered 2013-08-27 (×2): 1.8 mL

## 2013-08-27 MED ORDER — PROPOFOL 10 MG/ML IV BOLUS
INTRAVENOUS | Status: DC | PRN
  Administered 2013-08-27: 130 mg via INTRAVENOUS

## 2013-08-27 MED ORDER — LIDOCAINE HCL (CARDIAC) 20 MG/ML IV SOLN
INTRAVENOUS | Status: AC
  Filled 2013-08-27: qty 5

## 2013-08-27 MED ORDER — FENTANYL CITRATE 0.05 MG/ML IJ SOLN
INTRAMUSCULAR | Status: AC
  Filled 2013-08-27: qty 5

## 2013-08-27 MED ORDER — LIDOCAINE-EPINEPHRINE 2 %-1:100000 IJ SOLN
INTRAMUSCULAR | Status: AC
  Filled 2013-08-27: qty 8.5

## 2013-08-27 MED ORDER — PHENYLEPHRINE HCL 10 MG/ML IJ SOLN
INTRAMUSCULAR | Status: DC | PRN
  Administered 2013-08-27 (×7): 80 ug via INTRAVENOUS

## 2013-08-27 MED ORDER — PHENYLEPHRINE 40 MCG/ML (10ML) SYRINGE FOR IV PUSH (FOR BLOOD PRESSURE SUPPORT)
PREFILLED_SYRINGE | INTRAVENOUS | Status: AC
  Filled 2013-08-27: qty 20

## 2013-08-27 MED ORDER — ACETAMINOPHEN 325 MG PO TABS: 325.0000 mg | ORAL_TABLET | ORAL | Status: DC | PRN

## 2013-08-27 MED ORDER — FENTANYL CITRATE 0.05 MG/ML IJ SOLN
INTRAMUSCULAR | Status: DC | PRN
  Administered 2013-08-27: 100 ug via INTRAVENOUS

## 2013-08-27 MED ORDER — EPHEDRINE SULFATE 50 MG/ML IJ SOLN
INTRAMUSCULAR | Status: DC | PRN
  Administered 2013-08-27: 15 mg via INTRAVENOUS

## 2013-08-27 MED ORDER — LIDOCAINE-EPINEPHRINE 2 %-1:100000 IJ SOLN
INTRAMUSCULAR | Status: DC | PRN
  Administered 2013-08-27: 1.7 mL

## 2013-08-27 MED ORDER — OXYCODONE HCL 5 MG PO TABS: 5.0000 mg | ORAL_TABLET | Freq: Once | ORAL | Status: DC | PRN

## 2013-08-27 SURGICAL SUPPLY — 33 items
ALCOHOL 70% 16 OZ (MISCELLANEOUS) ×3 IMPLANT
ATTRACTOMAT 16X20 MAGNETIC DRP (DRAPES) ×3 IMPLANT
BLADE SURG 15 STRL LF DISP TIS (BLADE) ×2 IMPLANT
BLADE SURG 15 STRL SS (BLADE) ×6
COVER SURGICAL LIGHT HANDLE (MISCELLANEOUS) ×3 IMPLANT
GAUZE PACKING FOLDED 2  STR (GAUZE/BANDAGES/DRESSINGS) ×2
GAUZE PACKING FOLDED 2 STR (GAUZE/BANDAGES/DRESSINGS) ×1 IMPLANT
GAUZE SPONGE 4X4 16PLY XRAY LF (GAUZE/BANDAGES/DRESSINGS) ×3 IMPLANT
GLOVE BIOGEL PI IND STRL 6 (GLOVE) ×1 IMPLANT
GLOVE BIOGEL PI INDICATOR 6 (GLOVE) ×2
GLOVE SURG ORTHO 8.0 STRL STRW (GLOVE) ×3 IMPLANT
GLOVE SURG SS PI 6.0 STRL IVOR (GLOVE) ×3 IMPLANT
GOWN STRL REUS W/ TWL LRG LVL3 (GOWN DISPOSABLE) ×1 IMPLANT
GOWN STRL REUS W/TWL 2XL LVL3 (GOWN DISPOSABLE) ×3 IMPLANT
GOWN STRL REUS W/TWL LRG LVL3 (GOWN DISPOSABLE) ×3
KIT BASIN OR (CUSTOM PROCEDURE TRAY) ×3 IMPLANT
KIT ROOM TURNOVER OR (KITS) ×3 IMPLANT
MANIFOLD NEPTUNE WASTE (CANNULA) ×3 IMPLANT
NDL BLUNT 16X1.5 OR ONLY (NEEDLE) ×1 IMPLANT
NEEDLE BLUNT 16X1.5 OR ONLY (NEEDLE) ×3 IMPLANT
NS IRRIG 1000ML POUR BTL (IV SOLUTION) ×3 IMPLANT
PACK EENT II TURBAN DRAPE (CUSTOM PROCEDURE TRAY) ×3 IMPLANT
PAD ARMBOARD 7.5X6 YLW CONV (MISCELLANEOUS) ×3 IMPLANT
SPONGE SURGIFOAM ABS GEL 100 (HEMOSTASIS) IMPLANT
SPONGE SURGIFOAM ABS GEL 12-7 (HEMOSTASIS) IMPLANT
SPONGE SURGIFOAM ABS GEL SZ50 (HEMOSTASIS) IMPLANT
SUCTION FRAZIER TIP 10 FR DISP (SUCTIONS) ×3 IMPLANT
SUT CHROMIC 3 0 PS 2 (SUTURE) ×4 IMPLANT
SYR 50ML SLIP (SYRINGE) ×3 IMPLANT
TOWEL OR 17X26 10 PK STRL BLUE (TOWEL DISPOSABLE) ×3 IMPLANT
TUBE CONNECTING 12'X1/4 (SUCTIONS) ×1
TUBE CONNECTING 12X1/4 (SUCTIONS) ×2 IMPLANT
YANKAUER SUCT BULB TIP NO VENT (SUCTIONS) ×3 IMPLANT

## 2013-08-27 NOTE — Progress Notes (Signed)
PRE-OPERATIVE NOTE:  08/27/2013 Nancy Bates [id number]  VITALS: BP 113/50  Pulse 97  Temp(Src) 98.2 F (36.8 C) (Oral)  Resp 17  Ht 5\' 7"  (1.702 m)  Wt 103 lb (46.72 kg)  BMI 16.13 kg/m2  SpO2 98%  Lab Results  Component Value Date   WBC 7.5 08/25/2013   HGB 10.7* 08/25/2013   HCT 33.0* 08/25/2013   MCV 76.6* 08/25/2013   PLT 355 08/25/2013   BMET    Component Value Date/Time   NA 138 08/25/2013 1435   NA 142 08/03/2013 1323   K 3.6* 08/25/2013 1435   K 3.7 08/03/2013 1323   CL 103 08/25/2013 1435   CL 107 07/21/2012 0822   CO2 23 08/25/2013 1435   CO2 24 08/03/2013 1323   GLUCOSE 81 08/25/2013 1435   GLUCOSE 104 08/03/2013 1323   GLUCOSE 100* 07/21/2012 0822   BUN 14 08/25/2013 1435   BUN 10.4 08/03/2013 1323   CREATININE 0.69 08/25/2013 1435   CREATININE 0.8 08/03/2013 1323   CALCIUM 9.8 08/25/2013 1435   CALCIUM 9.6 08/03/2013 1323   GFRNONAA 83* 08/25/2013 1435   GFRAA >90 08/25/2013 1435    Lab Results  Component Value Date   INR 1.00 08/24/2013   INR 1.1 10/27/2008   INR 1.0 10/14/2008   No results found for this basename: PTT     [name] presents for extraction of remaining lower teeth with alveoloplasty in the Operating Room with general anesthesia.  SUBJECTIVE: The patient denies any acute medical or dental changes and agrees to proceed with treatment as planned.  EXAM: No sign of acute dental changes.  ASSESSMENT: Patient is affected by retained root, dental caries, chronic periodontitis, apical periodontitis.  PLAN: Patient agrees to proceed with treatment as planned in the operating room as previously discussed and accepts the risks, benefits, complications of the proposed treatment. We discussed risks for bleeding, bruising ,swelling, infection, nerve damage , root tip fracture, mandible fracture, and risks of anesthesia. Patient is aware of risk for other potential complications not mentioned above.   Lenn Cal, DDS

## 2013-08-27 NOTE — Transfer of Care (Signed)
Immediate Anesthesia Transfer of Care Note  Patient: Nancy Bates  Procedure(s) Performed: Procedure(s): Extraction of tooth #'s 20,21,22,23,24,25,26,27,28 with alveoloplasty (N/A)  Patient Location: PACU  Anesthesia Type:General  Level of Consciousness: awake, alert , oriented and sedated  Airway & Oxygen Therapy: Patient Spontanous Breathing and Patient connected to nasal cannula oxygen  Post-op Assessment: Report given to PACU RN, Post -op Vital signs reviewed and stable and Patient moving all extremities  Post vital signs: Reviewed and stable  Complications: No apparent anesthesia complications

## 2013-08-27 NOTE — H&P (Signed)
08/27/2013  Patient:            Nancy Bates Date of Birth:  [birth year] MRN:                [id number]  BP 113/50  Pulse 97  Temp(Src) 98.2 F (36.8 C) (Oral)  Resp 17  Ht 5\' 7"  (1.702 m)  Wt 103 lb (46.72 kg)  BMI 16.13 kg/m2  SpO2 98%  Patient denies acute medical or dental changes. However, patient had CT guided biopsy of lung revealing Squamous Cell Carcinoma. Please see note of 08/17/13 from Dr. Alvy Bimler to use as H and P for dental OR procedure.  Dr. [facility]  OFFICE PROGRESS NOTE  Patient Care Team:  Glendale Chard, MD as PCP - General (Internal Medicine)  Glendale Chard, MD as Attending Physician (Internal Medicine)  Lora Paula, MD (Radiation Oncology)  Melida Quitter, MD (Otolaryngology)  Nobie Putnam, MD (Hematology and Oncology)  DIAGNOSIS: Pyriform sinus cancer, new lung mass  SUMMARY OF ONCOLOGIC HISTORY:    Pyriform sinus cancer   I have reviewed her records extensively and collaborated the history with the patient. The patient was diagnosed with piriform sinus squamous cell carcinoma, clinical [id number]. She received concurrent chemotherapy with cisplatin every 3 weeks and daily radiation therapy. The radiation treatment was discontinued on 01/20/2009. Chemotherapy was discontinued on 12/20/2008. Her last CT scan in November 2013 was negative for disease recurrence  INTERVAL HISTORY:  Nancy Bates 73 y.o. female returns for evaluation.  The patient has lost a little weight recently. I ordered imaging study of the chest, neck and abdomen. She also saw the dentist recently for poor dental health.  She still had poor appetite. She has sensation of dry mouth and anorexia  I have reviewed the past medical history, past surgical history, social history and family history with the patient and they are unchanged from previous note.  ALLERGIES: is allergic to penicillins.  MEDICATIONS:  Current Outpatient Prescriptions   Medication  Sig   Dispense  Refill   .  albuterol (PROVENTIL,VENTOLIN) 90 MCG/ACT inhaler  Inhale 2 puffs into the lungs every 4 (four) hours as needed. For shortness of breath     .  antiseptic oral rinse (BIOTENE) LIQD  15 mLs by Mouth Rinse route every 2 (two) hours as needed. For dry mouth     .  aspirin 81 MG tablet  Take 81 mg by mouth daily.     Marland Kitchen  CALCIUM-VITAMIN D PO  Take 1 tablet by mouth daily.     .  chlorhexidine (PERIDEX) 0.12 % solution  Rinse with 15 mls twice daily for 30 seconds. Use after breakfast and at bedtime. Spit out excess. Do not swallow.  480 mL  6   .  Cholecalciferol (VITAMIN D3) 2000 UNITS capsule  Take 2,000 Units by mouth daily.     .  CRESTOR 10 MG tablet  10 mg daily.     Marland Kitchen  levothyroxine (SYNTHROID) 50 MCG tablet  Take 1 tablet (50 mcg total) by mouth daily before breakfast.  30 tablet  2   .  loratadine (CLARITIN) 10 MG tablet  Take 10 mg by mouth daily.     .  sitaGLIPtin (JANUVIA) 100 MG tablet  Take 100 mg by mouth daily.     .  Sodium Fluoride (PREVIDENT 5000 DRY MOUTH) 1.1 % PSTE  Place a thin ribbon of toothpaste onto tooth brush. Brush teeth at  bedtime for 2 minutes. Spit out excess. Do not swallow. Do not rinse with water afterwards.  106 mL  one year   .  tiotropium (SPIRIVA) 18 MCG inhalation capsule  Place 18 mcg into inhaler and inhale daily.      No current facility-administered medications for this visit.   REVIEW OF SYSTEMS:  Constitutional: Denies fevers, chills  Eyes: Denies blurriness of vision  Ears, nose, mouth, throat, and face: Denies mucositis or sore throat  All other systems were reviewed with the patient and are negative.  PHYSICAL EXAMINATION:  ECOG PERFORMANCE STATUS: 1 - Symptomatic but completely ambulatory  Filed Vitals:    08/17/13 1426   BP:  118/54   Pulse:  76   Temp:  98 F (36.7 C)   Resp:  18    Filed Weights    08/17/13 1426   Weight:  104 lb 3.2 oz (47.265 kg)   GENERAL:alert, no distress and comfortable. She appears thin  and cachectic  SKIN: skin color, texture, turgor are normal, no rashes or significant lesions  EYES: normal, Conjunctiva are pink and non-injected, sclera clear  OROPHARYNX:no exudate, no erythema and lips, buccal mucosa, and tongue normal . Poor dentition  NECK: supple, palpable mass behind the left sternocleidomastoid area  NEURO: alert & oriented x 3 with fluent speech, no focal motor/sensory deficits  LABORATORY DATA:  I have reviewed the data as listed    Component  Value  Date/Time    NA  142  08/03/2013 1323    NA  140  03/16/2012 0808    K  3.7  08/03/2013 1323    K  3.5  03/16/2012 0808    CL  107  07/21/2012 0822    CL  106  03/16/2012 0808    CO2  24  08/03/2013 1323    CO2  27  03/16/2012 0808    GLUCOSE  104  08/03/2013 1323    GLUCOSE  100*  07/21/2012 0822    GLUCOSE  88  03/16/2012 0808    BUN  10.4  08/03/2013 1323    BUN  13  03/16/2012 0808    CREATININE  0.8  08/03/2013 1323    CREATININE  0.81  03/16/2012 0808    CALCIUM  9.6  08/03/2013 1323    CALCIUM  9.0  03/16/2012 0808    PROT  8.1  08/03/2013 1323    PROT  7.2  03/16/2012 0808    ALBUMIN  2.9*  08/03/2013 1323    ALBUMIN  3.4*  03/16/2012 0808    AST  15  08/03/2013 1323    AST  14  03/16/2012 0808    ALT  <6  08/03/2013 1323    ALT  5  03/16/2012 0808    ALKPHOS  59  08/03/2013 1323    ALKPHOS  39  03/16/2012 0808    BILITOT  0.34  08/03/2013 1323    BILITOT  0.3  03/16/2012 0808    GFRNONAA  70*  03/16/2012 0808    GFRAA  82*  03/16/2012 0808    No results found for this basename: SPEP, UPEP, kappa and lambda light chains    Lab Results   Component  Value  Date    WBC  7.2  08/03/2013    NEUTROABS  4.8  08/03/2013    HGB  10.8*  08/03/2013    HCT  33.6*  08/03/2013    MCV  78.6*  08/03/2013  PLT  336  08/03/2013    Chemistry       Component  Value  Date/Time  Component  Value  Date/Time    NA  142  08/03/2013 1323   CALCIUM  9.6  08/03/2013 1323    NA  140  03/16/2012 0808   CALCIUM  9.0  03/16/2012  0808    K  3.7  08/03/2013 1323   ALKPHOS  59  08/03/2013 1323    K  3.5  03/16/2012 0808   ALKPHOS  39  03/16/2012 0808    CL  107  07/21/2012 0822   AST  15  08/03/2013 1323    CL  106  03/16/2012 0808   AST  14  03/16/2012 0808    CO2  24  08/03/2013 1323   ALT  <6  08/03/2013 1323    CO2  27  03/16/2012 0808   ALT  5  03/16/2012 0808    BUN  10.4  08/03/2013 1323   BILITOT  0.34  08/03/2013 1323    BUN  13  03/16/2012 0808   BILITOT  0.3  03/16/2012 0808    CREATININE  0.8  08/03/2013 1323     CREATININE  0.81  03/16/2012 0808    RADIOGRAPHIC STUDIES: I reviewed the CT scans with her and her daughter  I have personally reviewed the radiological images as listed and agreed with the findings in the report.  ASSESSMENT & PLAN:  #1 Pyriform sinus cancer  #2 left lung mass  I suspect this is due to recurrence of disease.  The patient was a smoker and it is possible the new lung mass could be a primary lung cancer.  The cancer is very extensive  I do not think the patient is a candidate for concurrent chemoradiation treatment due to poor performance status and cancer cachexia.  Bottom line is, I think she needs a biopsy.  I will order a CT-guided biopsy and present her case at the next ENT tumor board.  #3 poor dentition  I have consulted the dentist to perform dental extraction in anticipation for cancer treatment in the near future  #4 moderate 14-calorie malnutrition  She will continue to followup with the nutritionist  #5 anemia  This is likely anemia of chronic disease. The patient denies recent history of bleeding such as epistaxis, hematuria or hematochezia. She is asymptomatic from the anemia. We will observe for now. She does not require transfusion now.  Orders Placed This Encounter   Procedures    .  CT Biopsy      Standing Status:  Future      Number of Occurrences:       Standing Expiration Date:  08/17/2014      Order Specific Question:  Reason for Exam (SYMPTOM OR DIAGNOSIS  REQUIRED)     Answer:  left lung biopsy, Hx pyriform sinus cancer     Order Specific Question:  Preferred imaging location?     Answer:  [facility]   All questions were answered. The patient knows to call the clinic with any problems, questions or concerns. No barriers to learning was detected.  I spent 40 minutes counseling the patient face to face. The total time spent in the appointment was 55 minutes and more than 50% was on counseling and review of test results  Heath Lark, MD  08/17/2013 3:30 PM

## 2013-08-27 NOTE — Anesthesia Preprocedure Evaluation (Signed)
Anesthesia Evaluation  Patient identified by MRN, date of birth, ID band Patient awake    Reviewed: Allergy & Precautions, H&P , NPO status , Patient's Chart, lab work & pertinent test results  History of Anesthesia Complications Negative for: history of anesthetic complications  Airway Mallampati: II TM Distance: >3 FB Neck ROM: Full    Dental  (+) Poor Dentition, Edentulous Upper,    Pulmonary neg sleep apnea, COPD COPD inhaler, former smoker, neg PE breath sounds clear to auscultation        Cardiovascular - angina- Past MI and - CHF - dysrhythmias - Valvular Problems/MurmursRhythm:Regular     Neuro/Psych negative neurological ROS  negative psych ROS   GI/Hepatic Neg liver ROS, GERD-  Poorly Controlled,Esophageal Ca s/p chemo radiation   Endo/Other  diabetes, Type 2, Oral Hypoglycemic AgentsHypothyroidism   Renal/GU negative Renal ROS     Musculoskeletal negative musculoskeletal ROS (+)   Abdominal   Peds  Hematology  (+) anemia ,   Anesthesia Other Findings   Reproductive/Obstetrics                           Anesthesia Physical Anesthesia Plan  ASA: III  Anesthesia Plan: General   Post-op Pain Management:    Induction: Intravenous  Airway Management Planned: Oral ETT and Nasal ETT  Additional Equipment: None  Intra-op Plan:   Post-operative Plan: Extubation in OR  Informed Consent: I have reviewed the patients History and Physical, chart, labs and discussed the procedure including the risks, benefits and alternatives for the proposed anesthesia with the patient or authorized representative who has indicated his/her understanding and acceptance.   Dental advisory given  Plan Discussed with: CRNA and Surgeon  Anesthesia Plan Comments:         Anesthesia Quick Evaluation

## 2013-08-27 NOTE — Op Note (Signed)
Patient:            Nancy Bates Date of Birth:  1940/07/16 MRN:                [id number]   DATE OF PROCEDURE:  08/27/2013               OPERATIVE REPORT   PREOPERATIVE DIAGNOSES: 1.   History of cancer of the pyriform sinus  2.   Status post radiation therapy  3.   Chronic apical periodontitis 4.   Multiple retained root segments 5.   Dental caries 6.   Chronic periodontitis   POSTOPERATIVE DIAGNOSES: 1.   History of cancer of the pyriform sinus  2.   Status post radiation therapy  3.   Chronic apical periodontitis 4.   Multiple retained root segments 5.   Dental caries 6.   Chronic periodontitis  OPERATIONS: 1. Multiple extraction of tooth numbers 20, 21, 22, 23, 24, 25, 26, 27, and 28 2. Two Quadrants of alveoloplasty   SURGEON: Lenn Cal, DDS  ASSISTANT: Camie Patience, (dental assistant)  ANESTHESIA: General anesthesia via oral endotracheal tube.  MEDICATIONS: 1. Clindamycin 600 mg IV prior to invasive dental procedures. 2. Local anesthesia with a total utilization of two carpules each containing 34 mg of lidocaine with 0.017 mg of epinephrine as well as two carpules each containing 9 mg of bupivacaine with 0.009 mg of epinephrine.  SPECIMENS: There are 9 teeth that were discarded.  DRAINS: None  CULTURES: None  COMPLICATIONS: None   ESTIMATED BLOOD LOSS: Less than 50 mLs.  INTRAVENOUS FLUIDS: Lactated ringers solution per the anesthesia team record.  INDICATIONS: The patient was previously diagnosed with cancer of the pyriform sinus.  Patient is status post radiation therapy. Patient has significant xerostomia subsequently developed radiation caries involving all remaining lower teeth.  The patient was examined and treatment planned for extraction of remaining teeth with alveoloplasty.  This treatment plan was formulated to decrease the risks and complications associated with dental infection from affecting the patient's systemic health as well as  to assist in prosthodontic rehabilitation for the patient. Please note: Previous radiation therapy ports were identified and no remaining teeth were found to be in the primary field of radiation therapy. Patient therefore was at minimal risk for osteoradionecrosis.  OPERATIVE FINDINGS: Patient was examined operating room number 8.  The teeth were identified for extraction. The patient was noted be affected by chronic periodontitis, chronic apical periodontitis, dental caries, and multiple retained root segments.   DESCRIPTION OF PROCEDURE: Patient was brought to the main operating room number #8. Patient was then placed in the supine position on the operating table. General anesthesia was then induced per the anesthesia team. The patient was then prepped and draped in the usual manner for dental medicine procedure. A timeout was performed. The patient was identified and procedures were verified. A throat pack was placed at this time. The oral cavity was then thoroughly examined with the findings noted above. The patient was then ready for dental medicine procedure as follows:  Local anesthesia was then administered sequentially with a total utilization of two carpules each containing 34 mg of lidocaine with 0.017 mg of epinephrine as well as two carpules  each containing 9 mg bupivacaine with 0.009 mg of epinephrine.  At this point in time, the mandibular quadrants were approached. The patient was given bilateral inferior alveolar nerve blocks and long buccal nerve blocks utilizing the bupivacaine with epinephrine. Further infiltration was  then achieved utilizing the lidocaine with epinephrine. A 15 blade incision was then made from the distal of number #20 and extended to the distal of #28.  A surgical flap was then carefully reflected. The mandibular teeth were then subluxated with a series of straight elevators.Tooth numbers  20, 21, 22, 23, 24, 25, 26, 27, and 28 were then removed with a 151 forceps  without complications. Alveoloplasty was then performed utilizing a rongeurs and bone file. The tissues were approximated and trimmed appropriately. The surgical sites were then irrigated with copious amounts of sterile saline. The mandibular left surgical site was then closed from the distal of  20 and extended to the mesial numbers 24 utilizing 3-0 chromic gut suture in a continuous interrupted suture technique x1. The mandibular right surgical site was then closed from the distal of #28 and extended the mesial numbers 25 utilizing 3-0 chromic gut material in a continuous interrupted suture technique x1.  4 interrupted sutures were then placed to further close the mandibular surgical site as needed.  At this point time, the entire mouth was irrigated with copious amounts of sterile saline. The patient was examined for complications, seeing none, the dental medicine procedure was deemed to be complete. The throat pack was removed at this time. A series of 4 x 4 gauze were placed in the mouth to aid hemostasis. The patient was then handed over to the anesthesia team for final disposition. After an appropriate amount of time, the patient was extubated and taken to the postanesthsia care unit with stable vital signs and a good condition. All counts were correct for the dental medicine procedure.   Lenn Cal, DDS.

## 2013-08-27 NOTE — Discharge Instructions (Signed)
MOUTH CARE AFTER SURGERY ° °FACTS: °· Ice used in ice bag helps keep the swelling down, and can help lessen the pain. °· It is easier to treat pain BEFORE it happens. °· Spitting disturbs the clot and may cause bleeding to start again, or to get worse. °· Smoking delays healing and can cause complications. °· Sharing prescriptions can be dangerous.  Do not take medications not recently prescribed for you. °· Antibiotics may stop birth control pills from working.  Use other means of birth control while on antibiotics. °· Warm salt water rinses after the first 24 hours will help lessen the swelling:  Use 1/2 teaspoonful of table salt per oz.of water. ° °DO NOT: °· Do not spit.  Do not drink through a straw. °· Strongly advised not to smoke, dip snuff or chew tobacco at least for 3 days. °· Do not eat sharp or crunchy foods.  Avoid the area of surgery when chewing. °· Do not stop your antibiotics before your instructions say to do so. °· Do not eat hot foods until bleeding has stopped.  If you need to, let your food cool down to room temperature. ° °EXPECT: °· Some swelling, especially first 2-3 days. °· Soreness or discomfort in varying degrees.  Follow your dentist's instructions about how to handle pain before it starts. °· Pinkish saliva or light blood in saliva, or on your pillow in the morning.  This can last around 24 hours. °· Bruising inside or outside the mouth.  This may not show up until 2-3 days after surgery.  Don't worry, it will go away in time. °· Pieces of "bone" may work themselves loose.  It's OK.  If they bother you, let us know. ° °WHAT TO DO IMMEDIATELY AFTER SURGERY: °· Bite on the gauze with steady pressure for 1-2 hours.  Don't chew on the gauze. °· Do not lie down flat.  Raise your head support especially for the first 24 hours. °· Apply ice to your face on the side of the surgery.  You may apply it 20 minutes on and a few minutes off.  Ice for 8-12 hours.  You may use ice up to 24  hours. °· Before the numbness wears off, take a pain pill as instructed. °· Prescription pain medication is not always required. ° °SWELLING: °· Expect swelling for the first couple of days.  It should get better after that. °· If swelling increases 3 days or so after surgery; let us know as soon as possible. ° °FEVER: °· Take Tylenol every 4 hours if needed to lower your temperature, especially if it is at 100F or higher. °· Drink lots of fluids. °· If the fever does not go away, let us know. ° °BREATHING TROUBLE: °· Any unusual difficulty breathing means you have to have someone bring you to the emergency room ASAP ° °BLEEDING: °· Light oozing is expected for 24 hours or so. °· Prop head up with pillows °· Avoid spitting °· Do not confuse bright red fresh flowing blood with lots of saliva colored with a little bit of blood. °· If you notice some bleeding, place gauze or a tea bag where it is bleeding and apply CONSTANT pressure by biting down for 1 hour.  Avoid talking during this time.  Do not remove the gauze or tea bag during this hour to "check" the bleeding. °· If you notice bright RED bleeding FLOWING out of particular area, and filling the floor of your mouth, put   a wad of gauze on that area, bite down firmly and constantly.  Call us immediately.  If we're closed, have someone bring you to the emergency room.  ORAL HYGIENE:  Brush your teeth as usual after meals and before bedtime.  Use a soft toothbrush around the area of surgery.  DO NOT AVOID BRUSHING.  Otherwise bacteria(germs) will grow and may delay healing or encourage infection.  Since you cannot spit, just gently rinse and let the water flow out of your mouth.  DO NOT SWISH HARD.  EATING:  Cool liquids are a good point to start.  Increase to soft foods as tolerated.  PRESCRIPTIONS:  Follow the directions for your prescriptions exactly as written.  If Dr. Enrique Sack gave you a narcotic pain medication, do not drive, operate  machinery or drink alcohol when on that medication.  QUESTIONS:  Call our office during office hours [phone] or call the Emergency Room at [phone].  What to Eat after Tooth extraction:     For your first meals, you should eat lightly; only small meals at first.   Avoid Sharp, Crunchy, and Hot foods.   If you do not have nausea, you may eat larger meals.  Avoid spicy, greasy and heavy food, as these may make you sick after the anesthesia.  General Anesthesia, Adult, Care After  Refer to this sheet in the next few weeks. These instructions provide you with information on caring for yourself after your procedure. Your health care provider may also give you more specific instructions. Your treatment has been planned according to current medical practices, but problems sometimes occur. Call your health care provider if you have any problems or questions after your procedure.  WHAT TO EXPECT AFTER THE PROCEDURE  After the procedure, it is typical to experience:  Sleepiness.  Nausea and vomiting. HOME CARE INSTRUCTIONS  For the first 24 hours after general anesthesia:  Have a responsible person with you.  Do not drive a car. If you are alone, do not take public transportation.  Do not drink alcohol.  Do not take medicine that has not been prescribed by your health care provider.  Do not sign important papers or make important decisions.  You may resume a normal diet and activities as directed by your health care provider.  Change bandages (dressings) as directed.  If you have questions or problems that seem related to general anesthesia, call the hospital and ask for the anesthetist or anesthesiologist on call. SEEK MEDICAL CARE IF:  You have nausea and vomiting that continue the day after anesthesia.  You develop a rash. SEEK IMMEDIATE MEDICAL CARE IF:  You have difficulty breathing.  You have chest pain.  You have any allergic problems. Document Released: 07/30/2000 Document  Revised: 12/24/2012 Document Reviewed: 11/06/2012  [facility] Patient Information 2014 [place], [place].

## 2013-08-27 NOTE — Anesthesia Postprocedure Evaluation (Signed)
  Anesthesia Post-op Note  Patient: Nancy Bates  Procedure(s) Performed: Procedure(s): Extraction of tooth #'s 20,21,22,23,24,25,26,27,28 with alveoloplasty (N/A)  Patient Location: PACU  Anesthesia Type:General  Level of Consciousness: awake and alert   Airway and Oxygen Therapy: Patient Spontanous Breathing  Post-op Pain: mild  Post-op Assessment: Post-op Vital signs reviewed, Patient's Cardiovascular Status Stable, Respiratory Function Stable, Patent Airway, No signs of Nausea or vomiting and Pain level controlled  Post-op Vital Signs: Reviewed and stable  Last Vitals:  Filed Vitals:   08/27/13 1105  BP:   Pulse: 92  Temp: 36.1 C  Resp: 15    Complications: No apparent anesthesia complications

## 2013-09-01 ENCOUNTER — Encounter (HOSPITAL_COMMUNITY): Payer: Self-pay | Admitting: Dentistry

## 2013-09-03 ENCOUNTER — Encounter (HOSPITAL_COMMUNITY)
Admission: RE | Admit: 2013-09-03 | Discharge: 2013-09-03 | Disposition: A | Discharge status: Home / Self Care | Payer: Medicare Other | Source: Ambulatory Visit | Attending: Hematology and Oncology | Admitting: Hematology and Oncology

## 2013-09-03 ENCOUNTER — Encounter (HOSPITAL_COMMUNITY): Payer: Self-pay

## 2013-09-03 ENCOUNTER — Other Ambulatory Visit: Payer: Self-pay | Admitting: Hematology and Oncology

## 2013-09-03 ENCOUNTER — Ambulatory Visit (HOSPITAL_COMMUNITY)
Admission: RE | Admit: 2013-09-03 | Discharge: 2013-09-03 | Disposition: A | Discharge status: Home / Self Care | Payer: Medicare Other | Source: Ambulatory Visit | Attending: Hematology and Oncology | Admitting: Hematology and Oncology

## 2013-09-03 DIAGNOSIS — C3490 Malignant neoplasm of unspecified part of unspecified bronchus or lung: Secondary | ICD-10-CM | POA: Insufficient documentation

## 2013-09-03 DIAGNOSIS — E278 Other specified disorders of adrenal gland: Secondary | ICD-10-CM | POA: Insufficient documentation

## 2013-09-03 DIAGNOSIS — C12 Malignant neoplasm of pyriform sinus: Secondary | ICD-10-CM

## 2013-09-03 DIAGNOSIS — R222 Localized swelling, mass and lump, trunk: Secondary | ICD-10-CM | POA: Insufficient documentation

## 2013-09-03 DIAGNOSIS — R918 Other nonspecific abnormal finding of lung field: Secondary | ICD-10-CM

## 2013-09-03 MED ORDER — FLUDEOXYGLUCOSE F - 18 (FDG) INJECTION
5.0000 | Freq: Once | INTRAVENOUS | Status: AC | PRN
  Administered 2013-09-03: 5 via INTRAVENOUS

## 2013-09-03 MED ORDER — GADOBENATE DIMEGLUMINE 529 MG/ML IV SOLN
10.0000 mL | Freq: Once | INTRAVENOUS | Status: AC | PRN
  Administered 2013-09-03: 9 mL via INTRAVENOUS

## 2013-09-04 ENCOUNTER — Encounter: Payer: Self-pay | Admitting: Radiation Oncology

## 2013-09-04 LAB — GLUCOSE, CAPILLARY: Glucose-Capillary: 93 mg/dL (ref 70–99)

## 2013-09-04 NOTE — Progress Notes (Signed)
Thoracic Location of Tumor / Histology: left suprahilar upper lobe  Patient presented 1 months ago with symptoms of: positive results from ct scan of chest on 08-11-13: large left suprahilar upper lobe mass, associated mediastinal adenopathy Routine ct of chest.Patient asymptomatic  Biopsies of left lung mass (if applicable) revealed:  4/88/89 Diagnosis Lung, needle/core biopsy(ies), Left - INVASIVE SQUAMOUS CELL CARCINOMA, SEE COMMENT. Microscopic Comment The previous biopsies from the pyriform sinus and associated soft tissue demonstrating in situ and invasive squamous cell carcinoma is noted ([id number]). The current lung biopsies demonstrate diagnostic features of invasive squamous cell carcinoma. The case is reviewed with Dr. Tresa Moore who concurs.  Tobacco/Marijuana/Snuff/ETOH use: history of smoking 1.5 PPD x 50 years, quit 2007, history of drinking approx 2 40-oz beers daily, quit in 2005  Past/Anticipated interventions by cardiothoracic surgery, if any: none  Past/Anticipated interventions by medical oncology, if any: Dr Alvy Bimler: I do not think the patient is a candidate for concurrent chemoradiation treatment due to poor performance status and cancer cachexia.  Next appt on 09/07/13. Prior chemotherapy consisted of cisplatin every 3 weeks concurrent with daily radiation in 2010.  Signs/Symptoms  Weight changes, if any: lost 20 lbs in past 6 months  Respiratory complaints, if EKC:MKLK  Hemoptysis, if any: No  Pain issues, if JZP:HXTA   SAFETY ISSUES:  Prior radiation? 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 no  Possible current pregnancy? no  Is the patient on methotrexate? no  Current Complaints / other details:Widowed. 1 daughter.here with caretaker of ten  years.

## 2013-09-07 ENCOUNTER — Ambulatory Visit (HOSPITAL_BASED_OUTPATIENT_CLINIC_OR_DEPARTMENT_OTHER): Payer: Medicare Other | Admitting: Hematology and Oncology

## 2013-09-07 ENCOUNTER — Ambulatory Visit (HOSPITAL_COMMUNITY): Payer: Medicaid - Dental | Admitting: Dentistry

## 2013-09-07 ENCOUNTER — Ambulatory Visit
Admission: RE | Admit: 2013-09-07 | Discharge: 2013-09-07 | Disposition: A | Discharge status: Home / Self Care | Payer: Medicare Other | Source: Ambulatory Visit | Attending: Radiation Oncology | Admitting: Radiation Oncology

## 2013-09-07 ENCOUNTER — Encounter: Payer: Self-pay | Admitting: Radiation Oncology

## 2013-09-07 ENCOUNTER — Encounter (HOSPITAL_COMMUNITY): Payer: Self-pay | Admitting: Dentistry

## 2013-09-07 ENCOUNTER — Encounter: Payer: Self-pay | Admitting: Hematology and Oncology

## 2013-09-07 VITALS — BP 117/52 | HR 80 | Temp 98.0°F | Resp 18 | Wt 103.7 lb

## 2013-09-07 VITALS — BP 127/51 | HR 81 | Temp 97.8°F | Resp 18 | Ht 67.0 in | Wt 103.9 lb

## 2013-09-07 VITALS — BP 106/55 | HR 75 | Temp 98.5°F

## 2013-09-07 DIAGNOSIS — K08199 Complete loss of teeth due to other specified cause, unspecified class: Secondary | ICD-10-CM

## 2013-09-07 DIAGNOSIS — R634 Abnormal weight loss: Secondary | ICD-10-CM

## 2013-09-07 DIAGNOSIS — C12 Malignant neoplasm of pyriform sinus: Secondary | ICD-10-CM

## 2013-09-07 DIAGNOSIS — R918 Other nonspecific abnormal finding of lung field: Secondary | ICD-10-CM

## 2013-09-07 DIAGNOSIS — K0820 Unspecified atrophy of edentulous alveolar ridge: Secondary | ICD-10-CM

## 2013-09-07 DIAGNOSIS — E46 Unspecified protein-calorie malnutrition: Secondary | ICD-10-CM

## 2013-09-07 DIAGNOSIS — C3410 Malignant neoplasm of upper lobe, unspecified bronchus or lung: Secondary | ICD-10-CM | POA: Diagnosis present

## 2013-09-07 DIAGNOSIS — C7800 Secondary malignant neoplasm of unspecified lung: Secondary | ICD-10-CM

## 2013-09-07 DIAGNOSIS — K000 Anodontia: Secondary | ICD-10-CM

## 2013-09-07 DIAGNOSIS — R222 Localized swelling, mass and lump, trunk: Secondary | ICD-10-CM

## 2013-09-07 DIAGNOSIS — K08109 Complete loss of teeth, unspecified cause, unspecified class: Secondary | ICD-10-CM

## 2013-09-07 HISTORY — DX: Reserved for concepts with insufficient information to code with codable children: IMO0002

## 2013-09-07 NOTE — Progress Notes (Signed)
[facility] OFFICE PROGRESS NOTE  Patient Care Team: Glendale Chard, MD as PCP - General (Internal Medicine) Glendale Chard, MD as Attending Physician (Internal Medicine) Lora Paula, MD (Radiation Oncology) Melida Quitter, MD (Otolaryngology)  DIAGNOSIS: New lung mass, history of piriform sinus cancer  SUMMARY OF ONCOLOGIC HISTORY:   Pyriform sinus cancer   I have reviewed her records extensively and collaborated the history with the patient. The patient was diagnosed with piriform sinus squamous cell carcinoma, clinical [id number]. She received concurrent chemotherapy with cisplatin every 3 weeks and daily radiation therapy. The radiation treatment was discontinued on 01/20/2009. Chemotherapy was discontinued on 12/20/2008. Her last CT scan in November 2013 was negative for disease recurrence. Due to progressive weight loss, I recommend repeat imaging study. On 11/10/2013, CT scan of the neck, chest, abdomen and pelvis showed new mass in the lung. On 08/24/2013, CT-guided biopsy confirmed squamous cell carcinoma. On 09/03/2013, MRI of the head was negative. PET CT scan confirmed locally advanced cancer in the left upper lobe of the lung.  INTERVAL HISTORY: Nancy Bates 73 y.o. female returns for further followup. She has completed dental evaluation and extraction. She still had poor appetite.  I have reviewed the past medical history, past surgical history, social history and family history with the patient and they are unchanged from previous note.  ALLERGIES:  is allergic to penicillins.  MEDICATIONS:  Current Outpatient Prescriptions  Medication Sig Dispense Refill  . albuterol (PROVENTIL,VENTOLIN) 90 MCG/ACT inhaler Inhale 2 puffs into the lungs every 4 (four) hours as needed. For shortness of breath      . antiseptic oral rinse (BIOTENE) LIQD 15 mLs by Mouth Rinse route every 2 (two) hours as needed. For dry mouth      . aspirin 81 MG tablet Take 81 mg by  mouth daily.        Marland Kitchen CALCIUM-VITAMIN D PO Take 1 tablet by mouth daily.      . chlorhexidine (PERIDEX) 0.12 % solution Rinse with 15 mls twice daily for 30 seconds. Use after breakfast and at bedtime. Spit out excess. Do not swallow.  480 mL  6  . Cholecalciferol (VITAMIN D3) 2000 UNITS capsule Take 2,000 Units by mouth daily.      . CRESTOR 10 MG tablet 10 mg daily.       Marland Kitchen HYDROcodone-acetaminophen (NORCO/VICODIN) 5-325 MG per tablet Take 1 tablet by mouth every 6 (six) hours as needed for moderate pain.  30 tablet  0  . levothyroxine (SYNTHROID) 50 MCG tablet Take 1 tablet (50 mcg total) by mouth daily before breakfast.  30 tablet  2  . loratadine (CLARITIN) 10 MG tablet Take 10 mg by mouth daily.        . sitaGLIPtin (JANUVIA) 100 MG tablet Take 100 mg by mouth daily.        Marland Kitchen tiotropium (SPIRIVA) 18 MCG inhalation capsule Place 18 mcg into inhaler and inhale daily.         No current facility-administered medications for this visit.    REVIEW OF SYSTEMS:   Behavioral/Psych: Mood is stable, no new changes  All other systems were reviewed with the patient and are negative.  PHYSICAL EXAMINATION: ECOG PERFORMANCE STATUS: 2 - Symptomatic, <50% confined to bed  Filed Vitals:   09/07/13 1327  BP: 127/51  Pulse: 81  Temp: 97.8 F (36.6 C)  Resp: 18   Filed Weights   09/07/13 1327  Weight: 103 lb 14.4 oz (47.129 kg)  GENERAL:alert, no distress and comfortable. She looks thin and cachectic SKIN: skin color, texture, turgor are normal, no rashes or significant lesions Musculoskeletal:no cyanosis of digits and no clubbing  NEURO: alert & oriented x 3 with fluent speech, no focal motor/sensory deficits  LABORATORY DATA:  I have reviewed the data as listed    Component Value Date/Time   NA 138 08/25/2013 1435   NA 142 08/03/2013 1323   K 3.6* 08/25/2013 1435   K 3.7 08/03/2013 1323   CL 103 08/25/2013 1435   CL 107 07/21/2012 0822   CO2 23 08/25/2013 1435   CO2 24 08/03/2013 1323    GLUCOSE 81 08/25/2013 1435   GLUCOSE 104 08/03/2013 1323   GLUCOSE 100* 07/21/2012 0822   BUN 14 08/25/2013 1435   BUN 10.4 08/03/2013 1323   CREATININE 0.69 08/25/2013 1435   CREATININE 0.8 08/03/2013 1323   CALCIUM 9.8 08/25/2013 1435   CALCIUM 9.6 08/03/2013 1323   PROT 8.1 08/03/2013 1323   PROT 7.2 03/16/2012 0808   ALBUMIN 2.9* 08/03/2013 1323   ALBUMIN 3.4* 03/16/2012 0808   AST 15 08/03/2013 1323   AST 14 03/16/2012 0808   ALT <6 08/03/2013 1323   ALT 5 03/16/2012 0808   ALKPHOS 59 08/03/2013 1323   ALKPHOS 39 03/16/2012 0808   BILITOT 0.34 08/03/2013 1323   BILITOT 0.3 03/16/2012 0808   GFRNONAA 83* 08/25/2013 1435   GFRAA >90 08/25/2013 1435    No results found for this basename: SPEP,  UPEP,   kappa and lambda light chains    Lab Results  Component Value Date   WBC 7.5 08/25/2013   NEUTROABS 4.8 08/03/2013   HGB 10.7* 08/25/2013   HCT 33.0* 08/25/2013   MCV 76.6* 08/25/2013   PLT 355 08/25/2013      Chemistry      Component Value Date/Time   NA 138 08/25/2013 1435   NA 142 08/03/2013 1323   K 3.6* 08/25/2013 1435   K 3.7 08/03/2013 1323   CL 103 08/25/2013 1435   CL 107 07/21/2012 0822   CO2 23 08/25/2013 1435   CO2 24 08/03/2013 1323   BUN 14 08/25/2013 1435   BUN 10.4 08/03/2013 1323   CREATININE 0.69 08/25/2013 1435   CREATININE 0.8 08/03/2013 1323      Component Value Date/Time   CALCIUM 9.8 08/25/2013 1435   CALCIUM 9.6 08/03/2013 1323   ALKPHOS 59 08/03/2013 1323   ALKPHOS 39 03/16/2012 0808   AST 15 08/03/2013 1323   AST 14 03/16/2012 0808   ALT <6 08/03/2013 1323   ALT 5 03/16/2012 0808   BILITOT 0.34 08/03/2013 1323   BILITOT 0.3 03/16/2012 0808     RADIOGRAPHIC STUDIES: I reviewed the PET/CT scan with her and her caregiver. MRI of the head is negative. Pathology results come from squamous cell carcinoma. I have personally reviewed the radiological images as listed and agreed with the findings in the report.  ASSESSMENT & PLAN:  #1 Pyriform sinus cancer #2 left  lung mass I suspect this is due to recurrence of disease. The patient was a smoker and it is possible the new lung mass could be a primary lung cancer. The cancer is very extensive. I do not think the patient is a candidate for concurrent chemoradiation treatment due to poor performance status and cancer cachexia. I recommend consideration for palliative radiation therapy and repeat imaging study in the future. If the patient had residual disease or distant metastatic disease in the future,  I can consider offering her systemic chemotherapy in the future. #3 poor dentition She has completed dental extraction. #4 moderate protein calorie malnutrition She will continue to followup with the nutritionist  All questions were answered. The patient knows to call the clinic with any problems, questions or concerns. No barriers to learning was detected. I spent 40 minutes counseling the patient face to face. The total time spent in the appointment was 55 minutes and more than 50% was on counseling and review of test results     Heath Lark, MD 09/07/2013 3:37 PM

## 2013-09-07 NOTE — Progress Notes (Signed)
Radiation Oncology         [phone] ________________________________  Initial outpatient Consultation  Name: Nancy Bates MRN: [id number]  Date: 09/07/2013  DOB: 03-05-1941  ZS:[id number] N, MD  Heath Lark, MD   REFERRING PHYSICIAN: Heath Lark, MD  DIAGNOSIS: 73 year old woman with squamous or carcinoma of the left upper lung  HISTORY OF PRESENT ILLNESS::Nancy Bates is a 73 y.o. 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  She was treated with TomoTherapy with a parotid sparing approach to 70 Gy to planning target volume 1, 59.4 Gy to planning target volume 2 and 54 Gy to planning target volume 3 in 33 fractions.  She recently returned for routine follow-up complaining of unintentional weight loss.  CT chest/abdomen/pelvis on 08/11/13 showed Large left suprahilar upper lobe mass medially extending from the left superior hilum to the left apex with areas of necrosis. This measures 5.9 x 4.1 cm on axial image 20. This was considered to be most compatible of primary bronchogenic carcinoma and associated mediastinal adenopathy:  CT-guided biopsy on 08/24/13 revealed Squamous Cell Carcinoma:   PET-CT on 4/30 confirmed hypermetabolism SUV of 22 at primary tumor and borderline subcarinal lymph node:    Brain MRI on 4/30 revealed no brain metastases.  She has kindly been referred today to discuss possible radiation treatment options.  PREVIOUS RADIATION THERAPY: Yes as above  PAST MEDICAL HISTORY:  has a past medical history of COPD (chronic obstructive pulmonary disease); Hyperlipidemia; Hypothyroidism; Diabetes mellitus; Status post radiation therapy (11/22/08 thru 01/20/09); Status post chemotherapy (comp. 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    PAST SURGICAL HISTORY: Past Surgical History    Procedure Laterality Date  . Hiatal hernia repair    . Right ankle      S/p Fracture  . Direct laryngoscopy  07/07/08  . Debulking  07/07/08    Laser Debulking during direct Laryngoscopy  - Squmaous cell of the Pyriform Sinus   . Multiple extractions with alveoloplasty N/A 08/27/2013    Procedure: Extraction of tooth #'s 20,21,22,23,24,25,26,27,28 with alveoloplasty;  Surgeon: Lenn Cal, DDS;  Location: [facility];  Service: Oral Surgery;  Laterality: N/A;  . Lung biopsy Left 08/24/13    invasive squamous cell    FAMILY HISTORY: family history includes Cancer in her sister; Diabetes in her sister; Hypertension in her mother; Stroke in her mother.  SOCIAL HISTORY:  reports that she quit smoking about 12 years ago. Her smoking use included Cigarettes. She has a 150 pack-year smoking history. She has never used smokeless tobacco. She reports that she does not drink alcohol or use illicit drugs.  ALLERGIES: Penicillins  MEDICATIONS:  Current Outpatient Prescriptions  Medication Sig Dispense Refill  . albuterol (PROVENTIL,VENTOLIN) 90 MCG/ACT inhaler Inhale 2 puffs into the lungs every 4 (four) hours as needed. For shortness of breath      . antiseptic oral rinse (BIOTENE) LIQD 15 mLs by Mouth Rinse route every 2 (two) hours as needed. For dry mouth      . aspirin 81 MG tablet Take 81 mg by mouth daily.        Marland Kitchen CALCIUM-VITAMIN D PO Take 1 tablet by mouth daily.      . chlorhexidine (PERIDEX) 0.12 % solution Rinse with 15 mls twice daily for 30 seconds. Use after breakfast and at bedtime. Spit out excess. Do not swallow.  Curran  mL  6  . Cholecalciferol (VITAMIN D3) 2000 UNITS capsule Take 2,000 Units by mouth daily.      . CRESTOR 10 MG tablet 10 mg daily.       Marland Kitchen HYDROcodone-acetaminophen (NORCO/VICODIN) 5-325 MG per tablet Take 1 tablet by mouth every 6 (six) hours as needed for moderate pain.  30 tablet  0  . levothyroxine (SYNTHROID) 50 MCG tablet Take 1 tablet (50 mcg total) by mouth daily  before breakfast.  30 tablet  2  . loratadine (CLARITIN) 10 MG tablet Take 10 mg by mouth daily.        . sitaGLIPtin (JANUVIA) 100 MG tablet Take 100 mg by mouth daily.        Marland Kitchen tiotropium (SPIRIVA) 18 MCG inhalation capsule Place 18 mcg into inhaler and inhale daily.         No current facility-administered medications for this encounter.    REVIEW OF SYSTEMS:  A 15 point review of systems is documented in the electronic medical record. This was obtained by the nursing staff. However, I reviewed this with the patient to discuss relevant findings and make appropriate changes.  A comprehensive review of systems was negative.   PHYSICAL EXAM:  weight is 103 lb 11.2 oz (47.038 kg). Her temperature is 98 F (36.7 C). Her blood pressure is 117/52 and her pulse is 80. Her respiration is 18 and oxygen saturation is 100%.   Per medical oncology GENERAL:alert, no distress and comfortable. She looks thin and cachectic  SKIN: skin color, texture, turgor are normal, no rashes or significant lesions  Musculoskeletal:no cyanosis of digits and no clubbing  NEURO: alert & oriented x 3 with fluent speech, no focal motor/sensory deficits  KPS = 100  100 - Normal; no complaints; no evidence of disease. 90   - Able to carry on normal activity; minor signs or symptoms of disease. 80   - Normal activity with effort; some signs or symptoms of disease. 24   - Cares for self; unable to carry on normal activity or to do active work. 60   - Requires occasional assistance, but is able to care for most of his personal needs. 50   - Requires considerable assistance and frequent medical care. 78   - Disabled; requires special care and assistance. 65   - Severely disabled; hospital admission is indicated although death not imminent. 50   - Very sick; hospital admission necessary; active supportive treatment necessary. 10   - Moribund; fatal processes progressing rapidly. 0     - Dead  Karnofsky DA, Abelmann [place],  Craver LS and Burchenal JH [phone]) The use of the nitrogen mustards in the palliative treatment of carcinoma: with particular reference to bronchogenic carcinoma Cancer 1 [id number]  LABORATORY DATA:  Lab Results  Component Value Date   WBC 7.5 08/25/2013   HGB 10.7* 08/25/2013   HCT 33.0* 08/25/2013   MCV 76.6* 08/25/2013   PLT 355 08/25/2013   Lab Results  Component Value Date   NA 138 08/25/2013   K 3.6* 08/25/2013   CL 103 08/25/2013   CO2 23 08/25/2013   Lab Results  Component Value Date   ALT <6 08/03/2013   AST 15 08/03/2013   ALKPHOS 59 08/03/2013   BILITOT 0.34 08/03/2013     RADIOGRAPHY: Dg Chest 1 View  08/24/2013   CLINICAL DATA:  COPD.  Diabetes .  EXAM: CHEST - 1 VIEW  COMPARISON:  CT BIOPSY dated 08/24/2013; CT CHEST W/CM dated 08/11/2013  FINDINGS: Left upper lobe mass lesion with associated pleural thickening again noted. Lungs are otherwise clear of acute changes. Mild left base subsegmental atelectasis. Mediastinum and hilar structures are stable. Small hiatal hernia. Heart size normal. No pleural effusion or pneumothorax. No acute bony abnormality. Surgical clips right axilla.  IMPRESSION: Stable left upper lobe mass lesion with associated pleural thickening.   Electronically Signed   By: Marcello Moores  Register   On: 08/24/2013 14:20   Ct Soft Tissue Neck W Contrast  08/11/2013   CLINICAL DATA:  Head neck cancer status post chemotherapy and radiation therapy.  EXAM: CT NECK WITH CONTRAST  TECHNIQUE: Multidetector CT imaging of the neck was performed using the standard protocol following the bolus administration of intravenous contrast.  CONTRAST:  175mL OMNIPAQUE IOHEXOL 300 MG/ML  SOLN  COMPARISON:  CT NECK W/CM dated 12/28/2009; CT NECK W/CM dated 03/16/2011; NM PET IMAGE RESTAG (PS) SKULL BASE TO THIGH dated 07/07/2009; NM PET IMAGE INITIAL (PI) SKULL BASE TO THIGH dated 07/15/2008; DG CHEST 2 VIEW dated 03/16/2012  FINDINGS: Postradiation changes are again noted within supraglottic neck. There  is no evidence for residual or recurrent tumor in the neck.  A 6 mm left level 2 lymph node is stable. No significant cervical adenopathy is present.  Bone windows demonstrate mild leftward curvature in the upper thoracic spine. No focal lytic or blastic lesions are present.  A superior segment left lower lobe lung mass measures at least 4.5 x 5.1 x 5.0 cm. The lesion is incompletely imaged and more fully described on the chest CT from the same day. There is no invasion through the chest wall. The adjacent ribs are within normal limits.  IMPRESSION: 1. Postradiation changes the neck without evidence for residual or recurrent tumor in the neck. 2. 5.1 cm heterogeneous mass involving the superior segment of the left lower lobe compatible with a primary and long neoplasm. Metastatic disease is considered less likely.   Electronically Signed   By: Lawrence Santiago M.D.   On: 08/11/2013 14:52   Ct Chest W Contrast  08/11/2013   CLINICAL DATA:  History of piriform sinus cancer.  Weight loss.  EXAM: CT CHEST, ABDOMEN, AND PELVIS WITH CONTRAST  TECHNIQUE: Multidetector CT imaging of the chest, abdomen and pelvis was performed following the standard protocol during bolus administration of intravenous contrast.  CONTRAST:  170mL OMNIPAQUE IOHEXOL 300 MG/ML  SOLN  COMPARISON:  NM PET IMAGE RESTAG (PS) SKULL BASE TO THIGH dated 07/07/2009  FINDINGS: CT CHEST FINDINGS  Enhancing, partially necrotic mass noted in the medial left upper lobe. This measures 5.9 x 4.1 cm on image 20 of series 2. This extends to the left apex superiorly and the left hilum inferiorly. This is most compatible with primary bronchogenic carcinoma. Enlarged AP window lymph node measuring 9 mm on image 22. Subcarinal lymph node has a short axis diameter of 11 mm on image 28.  Moderate-sized hiatal hernia. Heart is normal size. Aorta is tortuous, normal caliber.  No pleural or pericardial effusion. Scarring noted in the right upper lobe/apex.  No acute bony  abnormality or focal bone lesion.  CT ABDOMEN AND PELVIS FINDINGS  Liver, gallbladder, spleen, pancreas, adrenals and kidneys are unremarkable.  Appendix is visualized and is normal. Moderate stool burden throughout the colon. Small bowel is decompressed. Uterus, adnexae, urinary bladder are unremarkable. No free fluid, free air or adenopathy. Multiple calcified phleboliths in the pelvis.  No acute bony abnormality or focal bone lesion.  IMPRESSION: Large left  suprahilar upper lobe mass medially extending from the left superior hilum to the left apex with areas of necrosis. This measures 5.9 x 4.1 cm on axial image 20. This is most compatible of primary bronchogenic carcinoma. Associated mediastinal adenopathy.  No acute findings or evidence of metastatic disease in the abdomen or pelvis.   Electronically Signed   By: Rolm Baptise M.D.   On: 08/11/2013 15:01   Mr Jeri Cos TK Contrast  09/03/2013   CLINICAL DATA:  73 year old female with new diagnosis lung cancer. Confusion. Staging. Subsequent encounter.  EXAM: MRI HEAD WITHOUT AND WITH CONTRAST  TECHNIQUE: Multiplanar, multiecho pulse sequences of the brain and surrounding structures were obtained without and with intravenous contrast.  CONTRAST:  67mL MULTIHANCE GADOBENATE DIMEGLUMINE 529 MG/ML IV SOLN  COMPARISON:  Neck CT 08/11/2013. Head CT without and with contrast 03/20/2012.  FINDINGS: Cerebral volume is within normal limits for age. No restricted diffusion to suggest acute infarction. No midline shift, mass effect, evidence of mass lesion, ventriculomegaly, extra-axial collection or acute intracranial hemorrhage. Cervicomedullary junction and pituitary are within normal limits. Grossly negative visualized cervical spine. Normal bone marrow signal.  No abnormal enhancement identified. Absent distal right vertebral artery flow void (series 6, image 3). The distal left vertebral artery appears dominant. Other Major intracranial vascular flow voids are  preserved. Pearline Cables and white matter signal is within normal limits for age throughout the brain.  Visualized orbit soft tissues are within normal limits. Visible internal auditory structures appear normal. Negative paranasal sinuses and mastoids. Visualized scalp soft tissues are within normal limits.  IMPRESSION: 1.  No acute or metastatic intracranial abnormality. 2. Poor flow or occlusion in the distal right vertebral artery, favor chronic and stable from recent neck CT. 3. Otherwise negative for age MRI appearance of the brain.   Electronically Signed   By: Lars Pinks M.D.   On: 09/03/2013 13:31   Ct Abdomen Pelvis W Contrast  08/11/2013   CLINICAL DATA:  History of piriform sinus cancer.  Weight loss.  EXAM: CT CHEST, ABDOMEN, AND PELVIS WITH CONTRAST  TECHNIQUE: Multidetector CT imaging of the chest, abdomen and pelvis was performed following the standard protocol during bolus administration of intravenous contrast.  CONTRAST:  178mL OMNIPAQUE IOHEXOL 300 MG/ML  SOLN  COMPARISON:  NM PET IMAGE RESTAG (PS) SKULL BASE TO THIGH dated 07/07/2009  FINDINGS: CT CHEST FINDINGS  Enhancing, partially necrotic mass noted in the medial left upper lobe. This measures 5.9 x 4.1 cm on image 20 of series 2. This extends to the left apex superiorly and the left hilum inferiorly. This is most compatible with primary bronchogenic carcinoma. Enlarged AP window lymph node measuring 9 mm on image 22. Subcarinal lymph node has a short axis diameter of 11 mm on image 28.  Moderate-sized hiatal hernia. Heart is normal size. Aorta is tortuous, normal caliber.  No pleural or pericardial effusion. Scarring noted in the right upper lobe/apex.  No acute bony abnormality or focal bone lesion.  CT ABDOMEN AND PELVIS FINDINGS  Liver, gallbladder, spleen, pancreas, adrenals and kidneys are unremarkable.  Appendix is visualized and is normal. Moderate stool burden throughout the colon. Small bowel is decompressed. Uterus, adnexae, urinary bladder  are unremarkable. No free fluid, free air or adenopathy. Multiple calcified phleboliths in the pelvis.  No acute bony abnormality or focal bone lesion.  IMPRESSION: Large left suprahilar upper lobe mass medially extending from the left superior hilum to the left apex with areas of necrosis. This measures 5.9  x 4.1 cm on axial image 20. This is most compatible of primary bronchogenic carcinoma. Associated mediastinal adenopathy.  No acute findings or evidence of metastatic disease in the abdomen or pelvis.   Electronically Signed   By: Rolm Baptise M.D.   On: 08/11/2013 15:01   Nm Pet Image Initial (pi) Skull Base To Thigh  09/03/2013   CLINICAL DATA:  Initial treatment strategy for Lung cancer.  EXAM: NUCLEAR MEDICINE PET SKULL BASE TO THIGH  TECHNIQUE: Five mCi F-18 FDG was injected intravenously. Full-ring PET imaging was performed from the skull base to thigh after the radiotracer. CT data was obtained and used for attenuation correction and anatomic localization.  FASTING BLOOD GLUCOSE:  Value: 93 mg/dl  COMPARISON:  None.  FINDINGS: NECK  No hypermetabolic mass or adenopathy within the soft tissues of the neck.  CHEST  Perihilar left upper lobe lung mass measures 5.7 cm in longest dimension and has an SUV max equal to 22.7. The tumor involves the left suprahilar region. 1.2 cm sub- carinal lymph node has an SUV max equal to 3.4. No hypermetabolic right paratracheal or right hilar lymph nodes. No additional hypermetabolic pulmonary nodules or mass is identified.  ABDOMEN/PELVIS  Very mild low level uptake is seen within the left adrenal gland which is prominent measuring 2.2. Corresponding nodule in the left adrenal gland measures 7 mm. The right adrenal gland appears normal. Normal appearance of the liver. The pancreas spleen and kidneys are unremarkable. No hypermetabolic adenopathy within the abdomen or pelvis.  SKELETON  No focal hypermetabolic activity to suggest skeletal metastasis.  IMPRESSION: 1.  Large left suprahilar upper lobe mass medially exhibits malignant range FDG uptake compatible with primary bronchogenic carcinoma.  2. Mild increased FDG uptake is associated with the borderline enlarged sub- carinal lymph node. No right paratracheal or right hilar pathologic adenopathy or hypermetabolic lymph nodes noted.  3. Equivocal uptake is associated with the 7 mm left adrenal nodule.   Electronically Signed   By: Kerby Moors M.D.   On: 09/03/2013 10:33   Ct Biopsy  08/24/2013   CLINICAL DATA:  73 year old with a left lung mass and history of piriform sinus cancer.  EXAM: CT GUIDED BONE MARROW ASPIRATES AND BIOPSY  Physician: Stephan Minister. Henn, MD  MEDICATIONS: Versed 1 mg, fentanyl 50 mcg. A radiology nurse monitored the patient for moderate sedation.  ANESTHESIA/SEDATION: Sedation time: 21 min  PROCEDURE: The procedure was explained to the patient. The risks and benefits of the procedure were discussed and the patient's questions were addressed. Informed consent was obtained from the patient. The patient was placed prone on the CT scanner. Images through the upper chest were obtained. The left side of the back was prepped with Betadine and a sterile drape was placed. Skin was anesthetized with 1% lidocaine. A 17 gauge needle was directed into the pleural-based left lung lesion. Needle position was confirmed within the mass. Two core biopsies were obtained with an 18 gauge device. Specimens were placed in formalin. 17 gauge needle was removed without complication. Bandage placed over the puncture site.  FINDINGS: Large mass along the medial aspect of the left upper lung and suprahilar region. Needle position confirmed within the lesion. No evidence for a pneumothorax following the left lung biopsy.  COMPLICATIONS: None  IMPRESSION: CT-guided biopsy of the left lung mass.   Electronically Signed   By: Markus Daft M.D.   On: 08/24/2013 13:33      IMPRESSION: This patient is a very nice  73 year old woman  with a history of hypopharyngeal cancer and a new left upper lung squamous cell carcinoma. The left upper lung lesion could potentially represent a primary bronchogenic carcinoma versus metastatic disease. Regardless, whether this represents a new disease process versus oligo metastatic disease, optimal local control is warranted whether with curative intent or durable palliation.  PLAN:Today, I talked to the patient and family about the findings and work-up thus far.  We discussed the natural history of disease and general treatment, highlighting the role or radiotherapy in the management.  We discussed the available radiation techniques, and focused on the details of logistics and delivery.  We reviewed the anticipated acute and late sequelae associated with radiation in this setting.  The patient was encouraged to ask questions that I answered to the best of my ability.  I filled out a patient counseling form during our discussion including treatment diagrams.  We retained a copy for our records.  The patient would like to proceed with radiation and will be scheduled for CT simulation.  She will return for radiation planning later this week.  I spent 30 minutes minutes face to face with the patient and more than 50% of that time was spent in counseling and/or coordination of care.  ------------------------------------------------  Sheral Apley. Tammi Klippel, M.D.

## 2013-09-07 NOTE — Progress Notes (Signed)
POST OPERATIVE NOTE:  09/07/2013 Nancy Bates [id number]  VITALS: BP 106/55  Pulse 75  Temp(Src) 98.5 F (36.9 C) (Oral)  LABS:  Lab Results  Component Value Date   WBC 7.5 08/25/2013   HGB 10.7* 08/25/2013   HCT 33.0* 08/25/2013   MCV 76.6* 08/25/2013   PLT 355 08/25/2013   BMET    Component Value Date/Time   NA 138 08/25/2013 1435   NA 142 08/03/2013 1323   K 3.6* 08/25/2013 1435   K 3.7 08/03/2013 1323   CL 103 08/25/2013 1435   CL 107 07/21/2012 0822   CO2 23 08/25/2013 1435   CO2 24 08/03/2013 1323   GLUCOSE 81 08/25/2013 1435   GLUCOSE 104 08/03/2013 1323   GLUCOSE 100* 07/21/2012 0822   BUN 14 08/25/2013 1435   BUN 10.4 08/03/2013 1323   CREATININE 0.69 08/25/2013 1435   CREATININE 0.8 08/03/2013 1323   CALCIUM 9.8 08/25/2013 1435   CALCIUM 9.6 08/03/2013 1323   GFRNONAA 83* 08/25/2013 1435   GFRAA >90 08/25/2013 1435    Lab Results  Component Value Date   INR 1.00 08/24/2013   INR 1.1 10/27/2008   INR 1.0 10/14/2008   No results found for this basename: PTT     Nancy Bates is status post extraction of remaining lower teeth with alveoloplasty in the operating room on 08/27/13.  SUBJECTIVE: Patient denies any significant oral discomfort. Patient denies having any sutures that remain. Patient is using chlorhexidine and salt water rinses as directed. Patient is not wearing her upper denture.  EXAM: There is no sign of infection, heme, or ooze. Sutures are gone. Patient would generalized primary closure. Patient has several areas that will need to heal in by secondary intention. Patient is now edentulous. Patient has persistent xerostomia.  ASSESSMENT: Post operative course is consistent with dental procedures performed in the operating room. The patient is now edentulous. There is atrophy of edentulous alveolar ridges.   PLAN: 1. Continue chlorhexidine rinses as prescribed. 2. Use salt water rinses as needed in between the chlorhexidine rinses. 3. Maintain soft  mechanical diet to avoid trauma to the healing areas. Use protein supplements with boost or Ensure as discussed. 4. Return to clinic for start of lower complete denture fabrication once prior approval from Medicaid has been obtained. This will be at least 3-4 weeks. Call if problems arise before then.  Lenn Cal, DDS

## 2013-09-07 NOTE — Patient Instructions (Signed)
Continue chlorhexidine rinses after breakfast and at bedtime. Use salt water rinses as needed to aid healing in between the chlorhexidine rinses. Return to clinic for evaluation of healing and to start fabrication of lower complete denture if prior approval from Medicaid is received. Proceed with soft mechanical diet and protein supplements as per dietary consult. Patient or Hoyle Sauer to call if problems arise before then. Dr. Enrique Sack

## 2013-09-07 NOTE — Progress Notes (Signed)
Please see the Nurse Progress Note in the MD Initial Consult Encounter for this patient. 

## 2013-09-10 ENCOUNTER — Ambulatory Visit
Admission: RE | Admit: 2013-09-10 | Discharge: 2013-09-10 | Disposition: A | Discharge status: Home / Self Care | Payer: Medicare Other | Source: Ambulatory Visit | Attending: Radiation Oncology | Admitting: Radiation Oncology

## 2013-09-10 DIAGNOSIS — C7800 Secondary malignant neoplasm of unspecified lung: Secondary | ICD-10-CM

## 2013-09-10 DIAGNOSIS — C3410 Malignant neoplasm of upper lobe, unspecified bronchus or lung: Secondary | ICD-10-CM | POA: Diagnosis not present

## 2013-09-10 NOTE — Progress Notes (Signed)
  Radiation Oncology         [phone] ________________________________  Name: Nancy Bates MRN: [id number]  Date: 09/10/2013  DOB: 04-02-1941  SIMULATION AND TREATMENT PLANNING NOTE  DIAGNOSIS:  73 yo woman with a left upper lung squamous cell carcinoma  NARRATIVE:  The patient was brought to the [facility].  Identity was confirmed.  All relevant records and images related to the planned course of therapy were reviewed.  The patient freely provided informed written consent to proceed with treatment after reviewing the details related to the planned course of therapy. The consent form was witnessed and verified by the simulation staff.  Then, the patient was set-up in a stable reproducible  supine position for radiation therapy.  CT images were obtained.  Surface markings were placed.  The CT images were loaded into the planning software.  Then the target and avoidance structures were contoured.  Treatment planning then occurred.  The radiation prescription was entered and confirmed.  Then, I designed and supervised the construction of a total of one medically necessary complex treatment device in the form of a bodyfix molded pillow positioner.  I have requested : Intensity Modulated Radiotherapy (IMRT) is medically necessary for this case for the following reason:  Previous treatment to this area..  This treatment constitutes a Special Treatment Procedure for the following reason: [ Retreatment in a previously radiated area requiring careful monitoring of increased risk of toxicity due to overlap of previous treatment.  I have ordered:Nutrition Consult  PLAN:  The patient will receive 66 Gy in 33 fractions.  ________________________________  Sheral Apley Tammi Klippel, M.D.

## 2013-09-20 DIAGNOSIS — C3410 Malignant neoplasm of upper lobe, unspecified bronchus or lung: Secondary | ICD-10-CM | POA: Diagnosis not present

## 2013-09-21 ENCOUNTER — Encounter: Payer: Self-pay | Admitting: Radiation Oncology

## 2013-09-21 ENCOUNTER — Ambulatory Visit
Admission: RE | Admit: 2013-09-21 | Discharge: 2013-09-21 | Disposition: A | Discharge status: Home / Self Care | Payer: Medicare Other | Source: Ambulatory Visit | Attending: Radiation Oncology | Admitting: Radiation Oncology

## 2013-09-21 VITALS — BP 132/76 | HR 96 | Temp 98.6°F | Resp 18 | Wt 103.0 lb

## 2013-09-21 DIAGNOSIS — C12 Malignant neoplasm of pyriform sinus: Secondary | ICD-10-CM

## 2013-09-21 DIAGNOSIS — C7800 Secondary malignant neoplasm of unspecified lung: Secondary | ICD-10-CM

## 2013-09-21 DIAGNOSIS — C3410 Malignant neoplasm of upper lobe, unspecified bronchus or lung: Secondary | ICD-10-CM | POA: Diagnosis not present

## 2013-09-21 MED ORDER — LEVOFLOXACIN 500 MG PO TABS: 500.0000 mg | ORAL_TABLET | Freq: Every day | ORAL | Status: DC

## 2013-09-21 NOTE — Progress Notes (Signed)
Patient presented to the clinic today with caregiver prior to treatment. Patient reports productive cough with yellow sputum, a sore throat, and hoarseness. Reports she is short of breath with exertion. Reports cough, sore throat and hoarseness began yesterday. Vital stable without fever. Denies pain. Reports fatigue.

## 2013-09-21 NOTE — Progress Notes (Signed)
   Department of Radiation Oncology  Phone:  [phone] Fax:        [phone]  Weekly Treatment Note    Name: [name] Date: 09/21/2013 MRN: [id number] DOB: [birth year]   Fraction:  1  MEDICATIONS: Current Outpatient Prescriptions  Medication Sig Dispense Refill  . albuterol (PROVENTIL,VENTOLIN) 90 MCG/ACT inhaler Inhale 2 puffs into the lungs every 4 (four) hours as needed. For shortness of breath      . antiseptic oral rinse (BIOTENE) LIQD 15 mLs by Mouth Rinse route every 2 (two) hours as needed. For dry mouth      . aspirin 81 MG tablet Take 81 mg by mouth daily.        Marland Kitchen CALCIUM-VITAMIN D PO Take 1 tablet by mouth daily.      . chlorhexidine (PERIDEX) 0.12 % solution Rinse with 15 mls twice daily for 30 seconds. Use after breakfast and at bedtime. Spit out excess. Do not swallow.  480 mL  6  . Cholecalciferol (VITAMIN D3) 2000 UNITS capsule Take 2,000 Units by mouth daily.      . CRESTOR 10 MG tablet 10 mg daily.       Marland Kitchen HYDROcodone-acetaminophen (NORCO/VICODIN) 5-325 MG per tablet Take 1 tablet by mouth every 6 (six) hours as needed for moderate pain.  30 tablet  0  . levothyroxine (SYNTHROID) 50 MCG tablet Take 1 tablet (50 mcg total) by mouth daily before breakfast.  30 tablet  2  . loratadine (CLARITIN) 10 MG tablet Take 10 mg by mouth daily.        . sitaGLIPtin (JANUVIA) 100 MG tablet Take 100 mg by mouth daily.        Marland Kitchen tiotropium (SPIRIVA) 18 MCG inhalation capsule Place 18 mcg into inhaler and inhale daily.         No current facility-administered medications for this encounter.     ALLERGIES: Penicillins   LABORATORY DATA:  Lab Results  Component Value Date   WBC 7.5 08/25/2013   HGB 10.7* 08/25/2013   HCT 33.0* 08/25/2013   MCV 76.6* 08/25/2013   PLT 355 08/25/2013   Lab Results  Component Value Date   NA 138 08/25/2013   K 3.6* 08/25/2013   CL 103 08/25/2013   CO2 23 08/25/2013   Lab Results  Component Value Date   ALT <6 08/03/2013   AST 15  08/03/2013   ALKPHOS 59 08/03/2013   BILITOT 0.34 08/03/2013     NARRATIVE: Nancy Bates was seen today for weekly treatment management. The chart was checked and the patient's films were reviewed. The patient was seen today prior to her first treatment for lung cancer. She has developed a facility called yesterday. Some wheezing. No fever.  PHYSICAL EXAMINATION: weight is 103 lb (46.72 kg). Her oral temperature is 98.6 F (37 C). Her blood pressure is 132/76 and her pulse is 96. Her respiration is 18 and oxygen saturation is 93%.      some wheezing and crackles are present bilaterally.  ASSESSMENT: The patient is doing satisfactorily with treatment.  The patient is suitable to begin radiation treatment and she will proceed with this. I discussed the patient using cough medicine, especially at night. I also indicated that I would call in an antibiotic for her.  PLAN: We will continue with the patient's radiation treatment as planned.

## 2013-09-22 ENCOUNTER — Ambulatory Visit
Admission: RE | Admit: 2013-09-22 | Discharge: 2013-09-22 | Disposition: A | Discharge status: Home / Self Care | Payer: Medicare Other | Source: Ambulatory Visit | Attending: Radiation Oncology | Admitting: Radiation Oncology

## 2013-09-22 DIAGNOSIS — C3410 Malignant neoplasm of upper lobe, unspecified bronchus or lung: Secondary | ICD-10-CM | POA: Diagnosis not present

## 2013-09-23 ENCOUNTER — Ambulatory Visit
Admission: RE | Admit: 2013-09-23 | Discharge: 2013-09-23 | Disposition: A | Discharge status: Home / Self Care | Payer: Medicare Other | Source: Ambulatory Visit | Attending: Radiation Oncology | Admitting: Radiation Oncology

## 2013-09-23 DIAGNOSIS — C3410 Malignant neoplasm of upper lobe, unspecified bronchus or lung: Secondary | ICD-10-CM | POA: Diagnosis not present

## 2013-09-24 ENCOUNTER — Ambulatory Visit
Admission: RE | Admit: 2013-09-24 | Discharge: 2013-09-24 | Disposition: A | Discharge status: Home / Self Care | Payer: Medicare Other | Source: Ambulatory Visit | Attending: Radiation Oncology | Admitting: Radiation Oncology

## 2013-09-24 DIAGNOSIS — C3410 Malignant neoplasm of upper lobe, unspecified bronchus or lung: Secondary | ICD-10-CM | POA: Diagnosis not present

## 2013-09-25 ENCOUNTER — Encounter: Payer: Self-pay | Admitting: Radiation Oncology

## 2013-09-25 ENCOUNTER — Ambulatory Visit
Admission: RE | Admit: 2013-09-25 | Discharge: 2013-09-25 | Disposition: A | Discharge status: Home / Self Care | Payer: Medicare Other | Source: Ambulatory Visit | Attending: Radiation Oncology | Admitting: Radiation Oncology

## 2013-09-25 VITALS — BP 124/73 | HR 103 | Temp 98.6°F | Resp 20 | Wt 101.2 lb

## 2013-09-25 DIAGNOSIS — C12 Malignant neoplasm of pyriform sinus: Secondary | ICD-10-CM

## 2013-09-25 DIAGNOSIS — C7800 Secondary malignant neoplasm of unspecified lung: Secondary | ICD-10-CM

## 2013-09-25 DIAGNOSIS — C3410 Malignant neoplasm of upper lobe, unspecified bronchus or lung: Secondary | ICD-10-CM | POA: Diagnosis not present

## 2013-09-25 NOTE — Progress Notes (Signed)
Weekly assessment of radiation to the chest.Completed 5 of 33 treatments.Reviewed routine of clinic and side effects of care.Mainly irritation of esophagus and fatigue.will have nutritionist to assess.Patient to continue soft diet and drink ensure or boost.

## 2013-09-25 NOTE — Progress Notes (Signed)
  Radiation Oncology         [phone]) [phone] ________________________________  Name: Nancy Bates MRN: [id number]  Date: 09/25/2013  DOB: 1940/10/02  Weekly Radiation Therapy Management  Current Dose: 10 Gy     Planned Dose:  66 Gy  Narrative . . . . . . . . The patient presents for routine under treatment assessment.                                   The patient is without complaint.                                 Set-up films were reviewed.                                 The chart was checked. Physical Findings. . .  weight is 101 lb 3.2 oz (45.904 kg). Her temperature is 98.6 F (37 C). Her blood pressure is 124/73 and her pulse is 103. Her respiration is 20 and oxygen saturation is 96%. . Weight essentially stable.  No significant changes. Impression . . . . . . . The patient is tolerating radiation. Plan . . . . . . . . . . . . Continue treatment as planned.  ________________________________  Sheral Apley. Tammi Klippel, M.D.

## 2013-09-29 ENCOUNTER — Ambulatory Visit
Admission: RE | Admit: 2013-09-29 | Discharge: 2013-09-29 | Disposition: A | Discharge status: Home / Self Care | Payer: Medicare Other | Source: Ambulatory Visit | Attending: Radiation Oncology | Admitting: Radiation Oncology

## 2013-09-29 DIAGNOSIS — C3410 Malignant neoplasm of upper lobe, unspecified bronchus or lung: Secondary | ICD-10-CM | POA: Diagnosis not present

## 2013-09-30 ENCOUNTER — Ambulatory Visit
Admission: RE | Admit: 2013-09-30 | Discharge: 2013-09-30 | Disposition: A | Discharge status: Home / Self Care | Payer: Medicare Other | Source: Ambulatory Visit | Attending: Radiation Oncology | Admitting: Radiation Oncology

## 2013-09-30 DIAGNOSIS — C3410 Malignant neoplasm of upper lobe, unspecified bronchus or lung: Secondary | ICD-10-CM | POA: Diagnosis not present

## 2013-10-01 ENCOUNTER — Ambulatory Visit
Admission: RE | Admit: 2013-10-01 | Discharge: 2013-10-01 | Disposition: A | Discharge status: Home / Self Care | Payer: Medicare Other | Source: Ambulatory Visit | Attending: Radiation Oncology | Admitting: Radiation Oncology

## 2013-10-01 ENCOUNTER — Encounter: Payer: Self-pay | Admitting: Radiation Oncology

## 2013-10-01 VITALS — BP 118/54 | HR 95 | Temp 97.7°F | Resp 20 | Wt 102.3 lb

## 2013-10-01 DIAGNOSIS — C7800 Secondary malignant neoplasm of unspecified lung: Secondary | ICD-10-CM

## 2013-10-01 DIAGNOSIS — C3410 Malignant neoplasm of upper lobe, unspecified bronchus or lung: Secondary | ICD-10-CM | POA: Diagnosis not present

## 2013-10-01 NOTE — Progress Notes (Signed)
Weekly rad txs  8/33 Lt chest, no c/o pain, still coughs up white phelgm at times, appetite good, no eating soft foods, no difficulty stated swallowing food or fluids

## 2013-10-01 NOTE — Progress Notes (Signed)
  Radiation Oncology         [phone] ________________________________  Name: SAMUEL RITTENHOUSE MRN: [id number]  Date: 10/01/2013  DOB: [birth year]  Weekly Radiation Therapy Management  Current Dose: 16 Gy     Planned Dose:  66 Gy  Narrative . . . . . . . . The patient presents for routine under treatment assessment.                                   The patient is without complaint.  Weekly assessment of radiation to the chest.Completed 8 of 33 treatments.                                 Set-up films were reviewed.                                 The chart was checked. Physical Findings. . .  weight is 102 lb 4.8 oz (46.403 kg). Her oral temperature is 97.7 F (36.5 C). Her blood pressure is 118/54 and her pulse is 95. Her respiration is 20 and oxygen saturation is 99%. . Weight essentially stable.  No significant changes. Impression . . . . . . . The patient is tolerating radiation. Plan . . . . . . . . . . . . Continue treatment as planned. Reviewed routine of clinic and side effects of care.Mainly irritation of esophagus and fatigue.will have nutritionist to assess.Patient to continue soft diet and drink ensure or boost    ________________________________  Sheral Apley. Tammi Klippel, M.D.

## 2013-10-02 ENCOUNTER — Ambulatory Visit
Admission: RE | Admit: 2013-10-02 | Discharge: 2013-10-02 | Disposition: A | Discharge status: Home / Self Care | Payer: Medicare Other | Source: Ambulatory Visit | Attending: Radiation Oncology | Admitting: Radiation Oncology

## 2013-10-02 DIAGNOSIS — C3410 Malignant neoplasm of upper lobe, unspecified bronchus or lung: Secondary | ICD-10-CM | POA: Diagnosis not present

## 2013-10-05 ENCOUNTER — Ambulatory Visit
Admission: RE | Admit: 2013-10-05 | Discharge: 2013-10-05 | Disposition: A | Discharge status: Home / Self Care | Payer: Medicare Other | Source: Ambulatory Visit | Attending: Radiation Oncology | Admitting: Radiation Oncology

## 2013-10-05 ENCOUNTER — Ambulatory Visit: Payer: Medicare Other | Admitting: Nutrition

## 2013-10-05 DIAGNOSIS — C3410 Malignant neoplasm of upper lobe, unspecified bronchus or lung: Secondary | ICD-10-CM | POA: Diagnosis not present

## 2013-10-05 NOTE — Progress Notes (Signed)
73 year old female diagnosed with lung cancer.  Past medical history includes: pyriform sinus cancer, diabetes mellitus, hypothyroidism, hyperlipidemia, COPD, microcytosis, weight loss, and protein calorie malnutrition  Medications include: albuterol, biotene, calcium and vitamin D supplements, peridex, crestor, vicodin, levaquin, synthroid, claritin, januvia, spiriva  Labs: K 3.6, Hgb 10.7, Hct 33.0  Height: 5'7'' Weight: 103 lbs UBW: 136 lbs (within the last year) BMI: 16.1, underweight  Estimated needs: Approximately 1400-1650 kcal, 70-80 g/protein, and ~1.7 Liters of fluid per day  Pt reports that she has been drinking nutritional supplements (3-4 per day) and has been eating well. She reports eating a large breakfast and snacks throughout the day that include cakes, bananas, and at least 2 glasses of milk. She has gained ~2 lbs in the past couple of weeks. Pt complains of dry mouth and occasional pain when swallowing from treatments.  Nutritional Diagnosis: Increased nutrient needs related to radiation therapy as evidenced by weight loss, improving  Intervention: Pt was educated on ways to increase calories and protein in her diet and ways to improve dry mouth. Used teach-back method and provided handouts with recipe ideas. Pt was given coupons and supplement samples to try at home including Ensure Clear, [employer], and Ensure Active.  Monitoring, evaluation, goals: Pt will minimize weight loss throughout treatment by consuming adequate calories and protein.  Next Visit: Pt has RD contact info. Follow-up as needed.

## 2013-10-06 ENCOUNTER — Ambulatory Visit
Admission: RE | Admit: 2013-10-06 | Discharge: 2013-10-06 | Disposition: A | Discharge status: Home / Self Care | Payer: Medicare Other | Source: Ambulatory Visit | Attending: Radiation Oncology | Admitting: Radiation Oncology

## 2013-10-06 DIAGNOSIS — C3410 Malignant neoplasm of upper lobe, unspecified bronchus or lung: Secondary | ICD-10-CM | POA: Diagnosis not present

## 2013-10-07 ENCOUNTER — Encounter (HOSPITAL_COMMUNITY): Payer: Self-pay | Admitting: Dentistry

## 2013-10-07 ENCOUNTER — Ambulatory Visit: Payer: Medicare Other | Admitting: Radiation Oncology

## 2013-10-07 ENCOUNTER — Ambulatory Visit (HOSPITAL_COMMUNITY): Payer: Medicaid - Dental | Admitting: Dentistry

## 2013-10-07 ENCOUNTER — Ambulatory Visit
Admission: RE | Admit: 2013-10-07 | Discharge: 2013-10-07 | Disposition: A | Discharge status: Home / Self Care | Payer: Medicare Other | Source: Ambulatory Visit | Attending: Radiation Oncology | Admitting: Radiation Oncology

## 2013-10-07 DIAGNOSIS — C3410 Malignant neoplasm of upper lobe, unspecified bronchus or lung: Secondary | ICD-10-CM | POA: Diagnosis not present

## 2013-10-07 NOTE — Patient Instructions (Signed)
Return to return to clinic for final impression of lower denture. Patient is to also bring her upper denture and will impress the upper denture at that time. Dr. Enrique Sack

## 2013-10-07 NOTE — Progress Notes (Signed)
10/07/2013  Patient:            Nancy Bates Date of Birth:  30-Jan-1941 MRN:                [id number]  BP 100/67  Pulse 79  Temp(Src) 98.3 F (36.8 C) (Oral)   [name] presents for start of lower denture fabrication. Exam: Patient is edentulous. The patient has an upper complete denture. Discussed procedures involved in lower denture fabrication an upper denture reline and prognosis for successful ability to wear dentures. Price for dentures confirmed.  Patient agrees to proceed with lower denture fabrication. Procedure:  Lower denture primary impressions in alginate. Lab pour. Patient did not bring the upper complete denture with her today. Patient will bring to her next appointment. To [facility] for lower denture custom tray fabrication. RTC for lower denture final impression and impression of the existing upper complete denture.   Lenn Cal, DDS

## 2013-10-08 ENCOUNTER — Ambulatory Visit
Admission: RE | Admit: 2013-10-08 | Discharge: 2013-10-08 | Disposition: A | Discharge status: Home / Self Care | Payer: Medicare Other | Source: Ambulatory Visit | Attending: Radiation Oncology | Admitting: Radiation Oncology

## 2013-10-08 DIAGNOSIS — C3410 Malignant neoplasm of upper lobe, unspecified bronchus or lung: Secondary | ICD-10-CM | POA: Diagnosis not present

## 2013-10-09 ENCOUNTER — Encounter: Payer: Self-pay | Admitting: Radiation Oncology

## 2013-10-09 ENCOUNTER — Ambulatory Visit
Admission: RE | Admit: 2013-10-09 | Discharge: 2013-10-09 | Disposition: A | Discharge status: Home / Self Care | Payer: Medicare Other | Source: Ambulatory Visit | Attending: Radiation Oncology | Admitting: Radiation Oncology

## 2013-10-09 VITALS — BP 105/60 | HR 84 | Resp 16 | Wt 104.0 lb

## 2013-10-09 DIAGNOSIS — C3410 Malignant neoplasm of upper lobe, unspecified bronchus or lung: Secondary | ICD-10-CM | POA: Diagnosis not present

## 2013-10-09 MED ORDER — RADIAPLEXRX EX GEL
Freq: Once | CUTANEOUS | Status: AC
  Administered 2013-10-09: 18:00:00 via TOPICAL

## 2013-10-09 NOTE — Progress Notes (Signed)
Reports a productive cough worse in the morning with yellow sputum. Reports cough in afternoon is intermittent with clear sputum. Denies difficulty swallowing or a sore throat. Reports persistent shortness of breath even at rest. Completed antibiotics on Tuesday. Reports mild fatigue. Denies pain. Denies skin changes to chest or upper back. Provided patient with tube of radiaplex today.

## 2013-10-11 ENCOUNTER — Encounter: Payer: Self-pay | Admitting: Radiation Oncology

## 2013-10-11 NOTE — Progress Notes (Signed)
  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 . . . . . . . The patient presents for routine under treatment assessment.                                   Reports a productive cough worse in the morning with yellow sputum. Reports cough in afternoon is intermittent with clear sputum. Denies difficulty swallowing or a sore throat. Reports persistent shortness of breath even at rest. Completed antibiotics on Tuesday. Reports mild fatigue. Denies pain. Denies skin changes to chest or upper back. Provided patient with tube of radiaplex today                                 Set-up films were reviewed.                                 The chart was checked. Physical Findings. . .  weight is 104 lb (47.174 kg). Her blood pressure is 105/60 and her pulse is 84. Her respiration is 16. . Weight essentially stable.  No significant changes. Impression . . . . . . . The patient is tolerating radiation. Plan . . . . . . . . . . . . Continue treatment as planned.  ________________________________  Sheral Apley. Tammi Klippel, M.D.

## 2013-10-12 ENCOUNTER — Ambulatory Visit
Admission: RE | Admit: 2013-10-12 | Discharge: 2013-10-12 | Disposition: A | Discharge status: Home / Self Care | Payer: Medicare Other | Source: Ambulatory Visit | Attending: Radiation Oncology | Admitting: Radiation Oncology

## 2013-10-12 DIAGNOSIS — C3410 Malignant neoplasm of upper lobe, unspecified bronchus or lung: Secondary | ICD-10-CM | POA: Diagnosis not present

## 2013-10-13 ENCOUNTER — Ambulatory Visit
Admission: RE | Admit: 2013-10-13 | Discharge: 2013-10-13 | Disposition: A | Discharge status: Home / Self Care | Payer: Medicare Other | Source: Ambulatory Visit | Attending: Radiation Oncology | Admitting: Radiation Oncology

## 2013-10-13 DIAGNOSIS — C3410 Malignant neoplasm of upper lobe, unspecified bronchus or lung: Secondary | ICD-10-CM | POA: Diagnosis not present

## 2013-10-14 ENCOUNTER — Ambulatory Visit (HOSPITAL_COMMUNITY): Payer: Medicaid - Dental | Admitting: Dentistry

## 2013-10-14 ENCOUNTER — Encounter (HOSPITAL_COMMUNITY): Payer: Self-pay | Admitting: Dentistry

## 2013-10-14 ENCOUNTER — Ambulatory Visit
Admission: RE | Admit: 2013-10-14 | Discharge: 2013-10-14 | Disposition: A | Discharge status: Home / Self Care | Payer: Medicare Other | Source: Ambulatory Visit | Attending: Radiation Oncology | Admitting: Radiation Oncology

## 2013-10-14 VITALS — BP 105/60 | HR 86 | Temp 98.3°F

## 2013-10-14 DIAGNOSIS — C3410 Malignant neoplasm of upper lobe, unspecified bronchus or lung: Secondary | ICD-10-CM | POA: Diagnosis not present

## 2013-10-14 DIAGNOSIS — R682 Dry mouth, unspecified: Secondary | ICD-10-CM

## 2013-10-14 DIAGNOSIS — Z463 Encounter for fitting and adjustment of dental prosthetic device: Secondary | ICD-10-CM

## 2013-10-14 DIAGNOSIS — K08109 Complete loss of teeth, unspecified cause, unspecified class: Secondary | ICD-10-CM

## 2013-10-14 DIAGNOSIS — K000 Anodontia: Principal | ICD-10-CM

## 2013-10-14 DIAGNOSIS — K117 Disturbances of salivary secretion: Secondary | ICD-10-CM

## 2013-10-14 DIAGNOSIS — K0820 Unspecified atrophy of edentulous alveolar ridge: Secondary | ICD-10-CM

## 2013-10-14 NOTE — Progress Notes (Signed)
10/14/2013  Patient:            Nancy Bates Date of Birth:  January 13, 1941 MRN:                [id number]  BP 105/60  Pulse 86  Temp(Src) 98.3 F (36.8 C) (Oral)  Nancy Bates presents for continued lower complete denture fabrication. Procedure:  Lower border molding and final impressions in Aquasil. Impression was made of the existing upper complete denture. Patient tolerated procedure well. To [facility] for custom baseplates with rims. Return to clinic for upper and lower complete denture jaw relations.  Lenn Cal, DDS

## 2013-10-14 NOTE — Patient Instructions (Signed)
Return to clinic as scheduled for continued lower denture fabrication. Dr. Enrique Sack

## 2013-10-15 ENCOUNTER — Ambulatory Visit
Admission: RE | Admit: 2013-10-15 | Discharge: 2013-10-15 | Disposition: A | Discharge status: Home / Self Care | Payer: Medicare Other | Source: Ambulatory Visit | Attending: Radiation Oncology | Admitting: Radiation Oncology

## 2013-10-15 DIAGNOSIS — C3410 Malignant neoplasm of upper lobe, unspecified bronchus or lung: Secondary | ICD-10-CM | POA: Diagnosis not present

## 2013-10-16 ENCOUNTER — Ambulatory Visit: Payer: Medicare Other

## 2013-10-16 ENCOUNTER — Ambulatory Visit: Payer: Medicare Other | Admitting: Radiation Oncology

## 2013-10-19 ENCOUNTER — Ambulatory Visit
Admission: RE | Admit: 2013-10-19 | Discharge: 2013-10-19 | Disposition: A | Discharge status: Home / Self Care | Payer: Medicare Other | Source: Ambulatory Visit | Attending: Radiation Oncology | Admitting: Radiation Oncology

## 2013-10-19 DIAGNOSIS — C3410 Malignant neoplasm of upper lobe, unspecified bronchus or lung: Secondary | ICD-10-CM | POA: Diagnosis not present

## 2013-10-20 ENCOUNTER — Ambulatory Visit
Admission: RE | Admit: 2013-10-20 | Discharge: 2013-10-20 | Disposition: A | Discharge status: Home / Self Care | Payer: Medicare Other | Source: Ambulatory Visit | Attending: Radiation Oncology | Admitting: Radiation Oncology

## 2013-10-20 ENCOUNTER — Encounter: Payer: Self-pay | Admitting: Radiation Oncology

## 2013-10-20 VITALS — BP 104/65 | HR 94 | Temp 98.7°F | Ht 67.0 in | Wt 102.6 lb

## 2013-10-20 DIAGNOSIS — C3410 Malignant neoplasm of upper lobe, unspecified bronchus or lung: Secondary | ICD-10-CM

## 2013-10-20 NOTE — Progress Notes (Signed)
Weekly Management Note:  Site: Left chest/lung Current Dose:  4000  cGy Projected Dose: 6600  cGy  Narrative: The patient is seen today for routine under treatment assessment. CBCT/MVCT images/port films were reviewed. The chart was reviewed.   She is without new complaints today. Her appetite remains fair. She denies dysphagia. Her weight is stable.  Physical Examination:  Filed Vitals:   10/20/13 1612  BP: 104/65  Pulse: 94  Temp:   .  Weight: 102 lb 9.6 oz (46.539 kg). Lungs are clear. No significant skin changes.  Impression: Tolerating radiation therapy well.  Plan: Continue radiation therapy as planned.

## 2013-10-20 NOTE — Progress Notes (Signed)
Ms. Has received 20 fractions to her left chest. BP low today with associated dizziness when standing and ambulating. Orthostatic vitals obtained with elevation of BP when standing.  Denies pain today,but admits fatigue. Encouraging fluids since her only liquid intake is 3 cans of supplement daily.  Accompanied by cargiver.

## 2013-10-21 ENCOUNTER — Ambulatory Visit: Admission: RE | Admit: 2013-10-21 | Payer: Medicare Other | Source: Ambulatory Visit

## 2013-10-22 ENCOUNTER — Ambulatory Visit: Payer: Medicare Other

## 2013-10-23 ENCOUNTER — Ambulatory Visit
Admission: RE | Admit: 2013-10-23 | Discharge: 2013-10-23 | Disposition: A | Discharge status: Home / Self Care | Payer: Medicare Other | Source: Ambulatory Visit | Attending: Radiation Oncology | Admitting: Radiation Oncology

## 2013-10-26 ENCOUNTER — Ambulatory Visit
Admission: RE | Admit: 2013-10-26 | Discharge: 2013-10-26 | Disposition: A | Discharge status: Home / Self Care | Payer: Medicare Other | Source: Ambulatory Visit | Attending: Radiation Oncology | Admitting: Radiation Oncology

## 2013-10-26 DIAGNOSIS — C3410 Malignant neoplasm of upper lobe, unspecified bronchus or lung: Secondary | ICD-10-CM | POA: Diagnosis not present

## 2013-10-27 ENCOUNTER — Ambulatory Visit
Admission: RE | Admit: 2013-10-27 | Discharge: 2013-10-27 | Disposition: A | Discharge status: Home / Self Care | Payer: Medicare Other | Source: Ambulatory Visit | Attending: Radiation Oncology | Admitting: Radiation Oncology

## 2013-10-27 DIAGNOSIS — C3410 Malignant neoplasm of upper lobe, unspecified bronchus or lung: Secondary | ICD-10-CM | POA: Diagnosis present

## 2013-10-28 ENCOUNTER — Ambulatory Visit (HOSPITAL_COMMUNITY): Payer: Medicaid - Dental | Admitting: Dentistry

## 2013-10-28 ENCOUNTER — Ambulatory Visit
Admission: RE | Admit: 2013-10-28 | Discharge: 2013-10-28 | Disposition: A | Discharge status: Home / Self Care | Payer: Medicare Other | Source: Ambulatory Visit | Attending: Radiation Oncology | Admitting: Radiation Oncology

## 2013-10-28 ENCOUNTER — Encounter (HOSPITAL_COMMUNITY): Payer: Self-pay | Admitting: Dentistry

## 2013-10-28 VITALS — BP 110/50 | HR 80 | Temp 98.4°F

## 2013-10-28 DIAGNOSIS — K08109 Complete loss of teeth, unspecified cause, unspecified class: Secondary | ICD-10-CM

## 2013-10-28 DIAGNOSIS — K117 Disturbances of salivary secretion: Secondary | ICD-10-CM

## 2013-10-28 DIAGNOSIS — Z463 Encounter for fitting and adjustment of dental prosthetic device: Secondary | ICD-10-CM

## 2013-10-28 DIAGNOSIS — K0820 Unspecified atrophy of edentulous alveolar ridge: Secondary | ICD-10-CM

## 2013-10-28 DIAGNOSIS — R682 Dry mouth, unspecified: Secondary | ICD-10-CM

## 2013-10-28 DIAGNOSIS — K000 Anodontia: Principal | ICD-10-CM

## 2013-10-28 DIAGNOSIS — C3410 Malignant neoplasm of upper lobe, unspecified bronchus or lung: Secondary | ICD-10-CM | POA: Diagnosis not present

## 2013-10-28 NOTE — Progress Notes (Signed)
10/28/2013  Patient:            Nancy Bates Date of Birth:  1941-04-30 MRN:                [id number]   BP 110/50  Pulse 80  Temp(Src) 98.4 F (36.9 C) (Oral)  Nancy Bates presents for continued denture fabrication. Procedure:  Upper and lower denture Jaw relations with aluwax bite registration. Patient agrees to tooth selection of lower teeth as needed (?P) and 10 degree posteriors to match with Portrait A1 shade. Patient tolerated procedure well. RTC for lower denture wax try in.   Lenn Cal, DDS

## 2013-10-28 NOTE — Patient Instructions (Signed)
Return to clinic as scheduled for continued fabrication of a lower complete denture. Dr. Enrique Sack

## 2013-10-29 ENCOUNTER — Encounter: Payer: Self-pay | Admitting: Radiation Oncology

## 2013-10-29 ENCOUNTER — Ambulatory Visit
Admission: RE | Admit: 2013-10-29 | Discharge: 2013-10-29 | Disposition: A | Discharge status: Home / Self Care | Payer: Medicare Other | Source: Ambulatory Visit | Attending: Radiation Oncology | Admitting: Radiation Oncology

## 2013-10-29 VITALS — BP 112/67 | HR 70 | Temp 98.2°F | Wt 102.9 lb

## 2013-10-29 DIAGNOSIS — C12 Malignant neoplasm of pyriform sinus: Secondary | ICD-10-CM

## 2013-10-29 DIAGNOSIS — C3410 Malignant neoplasm of upper lobe, unspecified bronchus or lung: Secondary | ICD-10-CM | POA: Diagnosis not present

## 2013-10-29 NOTE — Progress Notes (Signed)
Weekly assessment of radiation to left lung.Completed 25 of 33 treatments.Denies pain.Shortness of breath with exertion.Productive thick mucous.Discoloration of posterior chest.Continue application of gel at least twice daily.

## 2013-10-29 NOTE — Progress Notes (Signed)
  Radiation Oncology         (336) [phone] ________________________________  Name: Nancy Bates MRN: [id number]  Date: 10/29/2013  DOB: 1940/08/26  Weekly Radiation Therapy Management  Current Dose: 50 Gy     Planned Dose:  66 Gy  Narrative . . . . . . . . The patient presents for routine under treatment assessment.                                   .Completed 25 of 33 treatments.Denies pain.Shortness of breath with exertion.Productive thick mucous                                 Set-up films were reviewed.                                 The chart was checked. Physical Findings. . .  weight is 102 lb 14.4 oz (46.675 kg). Her temperature is 98.2 F (36.8 C). Her blood pressure is 112/67 and her pulse is 70. Her oxygen saturation is 100%. . Weight essentially stable.  No significant changes.  Discoloration of posterior chest. Impression . . . . . . . The patient is tolerating radiation. Plan . . . . . . . . . . . . Continue treatment as planned. Continue application of gel at least twice daily.  Will try robitussin.  ________________________________  Sheral Apley. Tammi Klippel, M.D.

## 2013-10-30 ENCOUNTER — Ambulatory Visit
Admission: RE | Admit: 2013-10-30 | Discharge: 2013-10-30 | Disposition: A | Discharge status: Home / Self Care | Payer: Medicare Other | Source: Ambulatory Visit | Attending: Radiation Oncology | Admitting: Radiation Oncology

## 2013-10-30 ENCOUNTER — Ambulatory Visit: Payer: Medicare Other | Admitting: Radiation Oncology

## 2013-10-30 DIAGNOSIS — C3410 Malignant neoplasm of upper lobe, unspecified bronchus or lung: Secondary | ICD-10-CM | POA: Diagnosis not present

## 2013-11-02 ENCOUNTER — Ambulatory Visit
Admission: RE | Admit: 2013-11-02 | Discharge: 2013-11-02 | Disposition: A | Discharge status: Home / Self Care | Payer: Medicare Other | Source: Ambulatory Visit | Attending: Radiation Oncology | Admitting: Radiation Oncology

## 2013-11-02 DIAGNOSIS — C3410 Malignant neoplasm of upper lobe, unspecified bronchus or lung: Secondary | ICD-10-CM | POA: Diagnosis not present

## 2013-11-03 ENCOUNTER — Ambulatory Visit
Admission: RE | Admit: 2013-11-03 | Discharge: 2013-11-03 | Disposition: A | Discharge status: Home / Self Care | Payer: Medicare Other | Source: Ambulatory Visit | Attending: Radiation Oncology | Admitting: Radiation Oncology

## 2013-11-03 DIAGNOSIS — C3410 Malignant neoplasm of upper lobe, unspecified bronchus or lung: Secondary | ICD-10-CM | POA: Diagnosis not present

## 2013-11-04 ENCOUNTER — Ambulatory Visit
Admission: RE | Admit: 2013-11-04 | Discharge: 2013-11-04 | Disposition: A | Discharge status: Home / Self Care | Payer: Medicare Other | Source: Ambulatory Visit | Attending: Radiation Oncology | Admitting: Radiation Oncology

## 2013-11-04 DIAGNOSIS — C3410 Malignant neoplasm of upper lobe, unspecified bronchus or lung: Secondary | ICD-10-CM | POA: Diagnosis not present

## 2013-11-05 ENCOUNTER — Ambulatory Visit: Payer: Medicare Other

## 2013-11-05 ENCOUNTER — Ambulatory Visit (HOSPITAL_COMMUNITY): Payer: Medicaid - Dental | Admitting: Dentistry

## 2013-11-05 ENCOUNTER — Ambulatory Visit
Admission: RE | Admit: 2013-11-05 | Discharge: 2013-11-05 | Disposition: A | Discharge status: Home / Self Care | Payer: Medicare Other | Source: Ambulatory Visit | Attending: Radiation Oncology | Admitting: Radiation Oncology

## 2013-11-05 ENCOUNTER — Encounter (HOSPITAL_COMMUNITY): Payer: Self-pay | Admitting: Dentistry

## 2013-11-05 ENCOUNTER — Encounter: Payer: Self-pay | Admitting: Radiation Oncology

## 2013-11-05 VITALS — BP 112/57 | HR 75 | Temp 97.8°F | Resp 16 | Ht 67.0 in | Wt 102.4 lb

## 2013-11-05 DIAGNOSIS — K08109 Complete loss of teeth, unspecified cause, unspecified class: Secondary | ICD-10-CM

## 2013-11-05 DIAGNOSIS — C3410 Malignant neoplasm of upper lobe, unspecified bronchus or lung: Secondary | ICD-10-CM | POA: Diagnosis not present

## 2013-11-05 MED ORDER — RADIAPLEXRX EX GEL
Freq: Once | CUTANEOUS | Status: AC
  Administered 2013-11-05: 16:00:00 via TOPICAL

## 2013-11-05 NOTE — Progress Notes (Signed)
   Department of Radiation Oncology  Phone:  [phone] Fax:        [phone]  Weekly Treatment Note    Name: Nancy Bates Date: 11/05/2013 MRN: [id number] DOB: 21-Nov-1940   Current dose: 60 Gy  Current fraction: 30   MEDICATIONS: Current Outpatient Prescriptions  Medication Sig Dispense Refill  . albuterol (PROVENTIL,VENTOLIN) 90 MCG/ACT inhaler Inhale 2 puffs into the lungs every 4 (four) hours as needed. For shortness of breath      . antiseptic oral rinse (BIOTENE) LIQD 15 mLs by Mouth Rinse route every 2 (two) hours as needed. For dry mouth      . aspirin 81 MG tablet Take 81 mg by mouth daily.        Marland Kitchen CALCIUM-VITAMIN D PO Take 1 tablet by mouth daily.      . chlorhexidine (PERIDEX) 0.12 % solution Rinse with 15 mls twice daily for 30 seconds. Use after breakfast and at bedtime. Spit out excess. Do not swallow.  480 mL  6  . Cholecalciferol (VITAMIN D3) 2000 UNITS capsule Take 2,000 Units by mouth daily.      . CRESTOR 10 MG tablet 10 mg daily.       Marland Kitchen levothyroxine (SYNTHROID) 50 MCG tablet Take 1 tablet (50 mcg total) by mouth daily before breakfast.  30 tablet  2  . loratadine (CLARITIN) 10 MG tablet Take 10 mg by mouth daily.        . sitaGLIPtin (JANUVIA) 100 MG tablet Take 100 mg by mouth daily.        Marland Kitchen tiotropium (SPIRIVA) 18 MCG inhalation capsule Place 18 mcg into inhaler and inhale daily.        Marland Kitchen HYDROcodone-acetaminophen (NORCO/VICODIN) 5-325 MG per tablet Take 1 tablet by mouth every 6 (six) hours as needed for moderate pain.  30 tablet  0   No current facility-administered medications for this encounter.     ALLERGIES: Penicillins   LABORATORY DATA:  Lab Results  Component Value Date   WBC 7.5 08/25/2013   HGB 10.7* 08/25/2013   HCT 33.0* 08/25/2013   MCV 76.6* 08/25/2013   PLT 355 08/25/2013   Lab Results  Component Value Date   NA 138 08/25/2013   K 3.6* 08/25/2013   CL 103 08/25/2013   CO2 23 08/25/2013   Lab Results  Component Value Date    ALT <6 08/03/2013   AST 15 08/03/2013   ALKPHOS 59 08/03/2013   BILITOT 0.34 08/03/2013     NARRATIVE: Nancy Bates was seen today for weekly treatment management. The chart was checked and the patient's films were reviewed. The patient states she is doing very well. She notes no shortness of breath. No esophagitis. She states that her skin is doing excellent. She does have some increased pigmentation in the back and she requests additional skin cream which has been given to her.  PHYSICAL EXAMINATION: height is 5\' 7"  (1.702 m) and weight is 102 lb 6.4 oz (46.448 kg). Her oral temperature is 97.8 F (36.6 C). Her blood pressure is 112/57 and her pulse is 75. Her respiration is 16 and oxygen saturation is 100%.        ASSESSMENT: The patient is doing satisfactorily with treatment.  PLAN: We will continue with the patient's radiation treatment as planned.

## 2013-11-05 NOTE — Progress Notes (Addendum)
Nancy Bates has completed 30/33 fractions to her left chest.  She denies pain, cough and hemoptysis.  She reports shortness of breath with activity but had it before radiation.  Her oxygen saturation is 100% on room air.  She denies fatigue   The skin on her left chest is intact.  She has hyperpigmentation on her left upper back.  She is using radiaplex gel.

## 2013-11-05 NOTE — Patient Instructions (Signed)
Return to clinic as scheduled for continued lower denture fabrication. Dr. Enrique Sack

## 2013-11-05 NOTE — Progress Notes (Signed)
11/05/2013  Patient:            Nancy Bates Date of Birth:  December 07, 1940 MRN:                [id number]  BP 108/63  Pulse 71  Temp(Src) 98.4 F (36.9 C) (Oral)  Nancy Bates presents for continued lower denture fabrication. Procedure:  Lower denture wax tryin. Patient accepts esthetics, phonetics, fit and function. Patient agrees to process "as is" in 50:50 characterized Lucitone 199. Patient to RTC for lower denture insertion.  Lenn Cal, DDS

## 2013-11-09 ENCOUNTER — Ambulatory Visit: Payer: Medicare Other

## 2013-11-09 ENCOUNTER — Other Ambulatory Visit (HOSPITAL_COMMUNITY): Payer: Self-pay | Admitting: Dentistry

## 2013-11-10 ENCOUNTER — Ambulatory Visit
Admission: RE | Admit: 2013-11-10 | Discharge: 2013-11-10 | Disposition: A | Discharge status: Home / Self Care | Payer: Medicare Other | Source: Ambulatory Visit | Attending: Radiation Oncology | Admitting: Radiation Oncology

## 2013-11-10 ENCOUNTER — Ambulatory Visit: Payer: Medicare Other

## 2013-11-10 DIAGNOSIS — C3410 Malignant neoplasm of upper lobe, unspecified bronchus or lung: Secondary | ICD-10-CM | POA: Diagnosis not present

## 2013-11-11 ENCOUNTER — Ambulatory Visit: Payer: Medicare Other

## 2013-11-11 ENCOUNTER — Ambulatory Visit
Admission: RE | Admit: 2013-11-11 | Discharge: 2013-11-11 | Disposition: A | Discharge status: Home / Self Care | Payer: Medicare Other | Source: Ambulatory Visit | Attending: Radiation Oncology | Admitting: Radiation Oncology

## 2013-11-11 DIAGNOSIS — C3410 Malignant neoplasm of upper lobe, unspecified bronchus or lung: Secondary | ICD-10-CM | POA: Diagnosis not present

## 2013-11-12 ENCOUNTER — Ambulatory Visit
Admission: RE | Admit: 2013-11-12 | Discharge: 2013-11-12 | Disposition: A | Discharge status: Home / Self Care | Payer: Medicare Other | Source: Ambulatory Visit | Attending: Radiation Oncology | Admitting: Radiation Oncology

## 2013-11-12 ENCOUNTER — Encounter: Payer: Self-pay | Admitting: Radiation Oncology

## 2013-11-12 ENCOUNTER — Ambulatory Visit: Payer: Medicare Other

## 2013-11-12 VITALS — BP 98/64 | HR 97 | Temp 98.2°F | Ht 67.0 in | Wt 101.4 lb

## 2013-11-12 DIAGNOSIS — C3410 Malignant neoplasm of upper lobe, unspecified bronchus or lung: Secondary | ICD-10-CM

## 2013-11-12 NOTE — Progress Notes (Signed)
Ms. Linck hs completed radiation therapy today to her left.  She denies pain today.  She reports more dryness on the left upper back region vs. The anterior chest. BP is low today and she admits to periods of dizziness when standing or ambulating.  Encouraged to increase fluid intake with water, juices, and gatorade,but decrease caffeine intact.

## 2013-11-12 NOTE — Progress Notes (Signed)
   Department of Radiation Oncology  Phone:  [phone] Fax:        [phone]  Weekly Treatment Note    Name: Nancy Bates Date: 11/12/2013 MRN: [id number] DOB: April 08, 1941   Current dose: 66 Gy  Current fraction: 33   MEDICATIONS: Current Outpatient Prescriptions  Medication Sig Dispense Refill  . albuterol (PROVENTIL,VENTOLIN) 90 MCG/ACT inhaler Inhale 2 puffs into the lungs every 4 (four) hours as needed. For shortness of breath      . antiseptic oral rinse (BIOTENE) LIQD 15 mLs by Mouth Rinse route every 2 (two) hours as needed. For dry mouth      . aspirin 81 MG tablet Take 81 mg by mouth daily.        Marland Kitchen CALCIUM-VITAMIN D PO Take 1 tablet by mouth daily.      . chlorhexidine (PERIDEX) 0.12 % solution Rinse with 15 mls twice daily for 30 seconds. Use after breakfast and at bedtime. Spit out excess. Do not swallow.  480 mL  6  . Cholecalciferol (VITAMIN D3) 2000 UNITS capsule Take 2,000 Units by mouth daily.      . CRESTOR 10 MG tablet 10 mg daily.       Marland Kitchen HYDROcodone-acetaminophen (NORCO/VICODIN) 5-325 MG per tablet Take 1 tablet by mouth every 6 (six) hours as needed for moderate pain.  30 tablet  0  . levothyroxine (SYNTHROID) 50 MCG tablet Take 1 tablet (50 mcg total) by mouth daily before breakfast.  30 tablet  2  . loratadine (CLARITIN) 10 MG tablet Take 10 mg by mouth daily.        . sitaGLIPtin (JANUVIA) 100 MG tablet Take 100 mg by mouth daily.        Marland Kitchen tiotropium (SPIRIVA) 18 MCG inhalation capsule Place 18 mcg into inhaler and inhale daily.         No current facility-administered medications for this encounter.     ALLERGIES: Penicillins   LABORATORY DATA:  Lab Results  Component Value Date   WBC 7.5 08/25/2013   HGB 10.7* 08/25/2013   HCT 33.0* 08/25/2013   MCV 76.6* 08/25/2013   PLT 355 08/25/2013   Lab Results  Component Value Date   NA 138 08/25/2013   K 3.6* 08/25/2013   CL 103 08/25/2013   CO2 23 08/25/2013   Lab Results  Component Value Date    ALT <6 08/03/2013   AST 15 08/03/2013   ALKPHOS 59 08/03/2013   BILITOT 0.34 08/03/2013     NARRATIVE: Nancy Bates was seen today for weekly treatment management. The chart was checked and the patient's films were reviewed. The patient is doing well today. She finished her final fraction today and is very pleased. The patient has had a little bit of dizziness on standing and nursing has discussed the patient drinking more fluids. She states that she is able to swallow well so this should not be a problem. She continues to take Carafate medication which helps with esophagitis.  PHYSICAL EXAMINATION: height is 5\' 7"  (1.702 m) and weight is 101 lb 6.4 oz (45.995 kg). Her temperature is 98.2 F (36.8 C). Her blood pressure is 98/64 and her pulse is 97.      hyperpigmentation with some dryness of skin posteriorly, anteriorly the skin looks excellent  ASSESSMENT: The patient  did satisfactorily with treatment.  PLAN: Followup in one month with Dr. Tammi Klippel.

## 2013-11-13 NOTE — Progress Notes (Signed)
°  Radiation Oncology         [phone] ________________________________  Name: Nancy Bates MRN: [id number]  Date: 11/12/2013  DOB: 08-01-1940  End of Treatment Note  Diagnosis:   73 yo woman with a left upper lung squamous cell carcinoma  Indication for treatment:  Curative       Radiation treatment dates:   09/21/2013-11/12/2013  Site/dose:   The left upper lung mass was treated to 66 Gy in 33 fractions.  Beams/energy:   The patient was treated using helical intensity modulated radiotherapy delivering 6 megavolt photons. Image guidance was performed with megavoltage CT studies prior to each fraction.  Narrative: The patient tolerated radiation treatment relatively well.   She had some cough which responded to Robitussin.  No esophagitis.  Plan: The patient has completed radiation treatment. The patient will return to radiation oncology clinic for routine followup in one month. I advised them to call or return sooner if they have any questions or concerns related to their recovery or treatment. ________________________________  Sheral Apley. Tammi Klippel, M.D.

## 2013-11-17 ENCOUNTER — Encounter (HOSPITAL_COMMUNITY): Payer: Self-pay | Admitting: Dentistry

## 2013-11-17 ENCOUNTER — Ambulatory Visit (HOSPITAL_COMMUNITY): Payer: Medicaid - Dental | Admitting: Dentistry

## 2013-11-17 VITALS — BP 96/55 | HR 72 | Temp 98.2°F

## 2013-11-17 DIAGNOSIS — K0820 Unspecified atrophy of edentulous alveolar ridge: Secondary | ICD-10-CM

## 2013-11-17 DIAGNOSIS — K08109 Complete loss of teeth, unspecified cause, unspecified class: Secondary | ICD-10-CM

## 2013-11-17 DIAGNOSIS — K117 Disturbances of salivary secretion: Secondary | ICD-10-CM

## 2013-11-17 DIAGNOSIS — Z463 Encounter for fitting and adjustment of dental prosthetic device: Secondary | ICD-10-CM

## 2013-11-17 DIAGNOSIS — R682 Dry mouth, unspecified: Secondary | ICD-10-CM

## 2013-11-17 DIAGNOSIS — K000 Anodontia: Principal | ICD-10-CM

## 2013-11-17 NOTE — Patient Instructions (Addendum)
Patient to keep dentures out if sore spots develop. Use salt water rinses as needed to aid healing. Return to clinic as scheduled for denture adjustment.  Call if problems arise before then.  Lenn Cal, DDS    Instructions for Denture Use and Care  Congratulations, you are on the way to oral rehabilitation!  You have just received a new set of complete or partial dentures.  These prostheses will help to improve both your appearance and chewing ability.  These instructions will help you get adjusted to your dentures as well as care for them properly.  Please read these instructions carefully and completely as soon as you get home.  If you or your caregiver have any questions please notify the [facility] at [phone].  HOW YOUR DENTURES LOOK AND FEEL Soon after you begin wearing your dentures, you may feel that your dentures are too large or even loose.  As our mouth and facial muscles become accustomed to the dentures, these feelings will go away.  You also may feel that you are salivating more than you normally do.  This feeling should go away as you get used to having the dentures in your mouth.  You may bite your cheek or your tongue; this will eventually resolve itself as you wear your dentures.  Some soreness is to be expected, but you should not hurt.  If your mouth hurts, call your dentist.  A denture adhesive may occasionally be necessary to hold your dentures in place more securely.  The dentist will let you know when one is recommended for you.  SPEAKING Wearing dentures will change the sound of your voice initially.  This will be noticed by you more than anyone else.  Bite and swallow before you speak, in order to place your dentures in position so that you may speak more clearly.  Practice speaking by reading aloud or counting from 1 to 100 very slowly and distinctly.  After some practice your mouth will become accustomed to your dentures and you will speak  more clearly.  EATING Chewing will definitely be different after you receive your dentures.  With a little practice and patience you should be able to eat just about any kind of food.  Begin by eating small quantities of food that are cut into small pieces.  Star with soft foods such as eggs, cooked vegetables, or puddings.  As you gain confidence advance  Your diet to whatever texture foods you can tolerate.  DENTURE CARE Dentures can collect plaque and calculus much the same as natural teeth can.  If not removed on a regular basis, your dentures will not look or feel clean, and you will experience denture odor.  It is very important that you remove your dentures at bedtime and clean them thoroughly.  You should: 1. Clean your dentures over a sink full of water so if dropped, breakage will be prevented. 2. Rinse your dentures with cool water to remove any large food particles. 3. Use soap and water or a denture cleanser or paste to clean the dentures.  Do not use regular toothpaste as it may abrade the denture base or teeth. 4. Use a moistened denture brush to clean all surfaces (inside and outside). 5. Rinse thoroughly to remove any remaining soap or denture cleanser. 6. Use a soft bristle toothbrush to gently brush any natural teeth, gums, tongue, and palate at bedtime and before reinserting your dentures. 7. Do not sleep with your dentures in  your mouth at night.  Remove your dentures and soak them overnight in a denture cup filled with water or denture solution as recommended by your dentist.  This routine will become second nature and will increase the life and comfort of your dentures.  Please do not try to adjust these dentures yourself; you could damage them.  FOLLOW-UP You should call or make an appointment with your dentist.  Your dentist would like to see you at least once a year for a check-up and examination.

## 2013-11-17 NOTE — Progress Notes (Signed)
11/17/2013  Patient:            Nancy Bates Date of Birth:  07/15/1940 MRN:                [id number]  BP 96/55  Pulse 72  Temp(Src) 98.2 F (36.8 C) (Oral)  Nancy Bates presents for insertion of lower complete denture along with previous upper denture that she has not been wearing. Procedure: Pressure indicating paste was applied to the dentures. Adjustments were made as needed. [place] Occlusion evaluated and adjustments made as needed for Centric Relation and protrusive strokes. Good esthetics, phonetics, fit, and function noted. Patient accepts results. Post op instructions provided in written and verbal formats on use and care of dentures. Gave patient denture brush and cup. Patient to keep dentures out if sore spots develop. Use salt water rinses as needed to aid healing. Return to clinic as scheduled for denture adjustment.  Call if problems arise before then. Patient dismissed in stable condition.  Lenn Cal, DDS

## 2013-11-23 ENCOUNTER — Encounter (HOSPITAL_COMMUNITY): Payer: Self-pay | Admitting: Dentistry

## 2013-11-23 ENCOUNTER — Ambulatory Visit (HOSPITAL_COMMUNITY): Payer: Medicaid - Dental | Admitting: Dentistry

## 2013-11-23 ENCOUNTER — Encounter (INDEPENDENT_AMBULATORY_CARE_PROVIDER_SITE_OTHER): Payer: Self-pay

## 2013-11-23 VITALS — BP 106/58 | HR 72 | Temp 99.2°F

## 2013-11-23 DIAGNOSIS — R682 Dry mouth, unspecified: Secondary | ICD-10-CM

## 2013-11-23 DIAGNOSIS — K08109 Complete loss of teeth, unspecified cause, unspecified class: Secondary | ICD-10-CM

## 2013-11-23 DIAGNOSIS — Z463 Encounter for fitting and adjustment of dental prosthetic device: Secondary | ICD-10-CM

## 2013-11-23 DIAGNOSIS — K117 Disturbances of salivary secretion: Secondary | ICD-10-CM

## 2013-11-23 DIAGNOSIS — Z972 Presence of dental prosthetic device (complete) (partial): Secondary | ICD-10-CM

## 2013-11-23 DIAGNOSIS — K0820 Unspecified atrophy of edentulous alveolar ridge: Secondary | ICD-10-CM

## 2013-11-23 DIAGNOSIS — C3410 Malignant neoplasm of upper lobe, unspecified bronchus or lung: Secondary | ICD-10-CM

## 2013-11-23 DIAGNOSIS — K000 Anodontia: Principal | ICD-10-CM

## 2013-11-23 NOTE — Patient Instructions (Addendum)
Patient to keep dentures out if sore spots develop. Use salt water rinses as needed to aid healing. Return to clinic as scheduled for denture adjustment.  Will plan on upper reline in one month. Call if problems arise before then.  Dr. Enrique Sack

## 2013-11-23 NOTE — Progress Notes (Signed)
11/23/2013  Patient:            Nancy Bates Date of Birth:  [birth year] MRN:                [id number]  BP 106/58  Pulse 72  Temp(Src) 99.2 F (37.3 C) (Oral)  [name] presents for evaluation of recently inserted lower complete denture along with previous upper denture. SUBJECTIVE: The patient is not complaining of any denture irritation involving the upper or lower dentures.. OBJECTIVE: There is no sign of denture irritation or erythema. Procedure: Pressure indicating paste was applied to the dentures. Adjustments were made as needed. [place] Occlusion evaluated and adjustments made as needed for Centric Relation and protrusive strokes. Patient accepts results.  Patient to keep dentures out if sore spots develop. Use salt water rinses as needed to aid healing. Return to clinic as scheduled for denture adjustment.  Will plan on upper reline in one month. Call if problems arise before then.  Lenn Cal, DDS

## 2013-12-07 ENCOUNTER — Ambulatory Visit (HOSPITAL_COMMUNITY): Payer: Medicaid - Dental | Admitting: Dentistry

## 2013-12-07 ENCOUNTER — Encounter (INDEPENDENT_AMBULATORY_CARE_PROVIDER_SITE_OTHER): Payer: Self-pay

## 2013-12-07 ENCOUNTER — Encounter (HOSPITAL_COMMUNITY): Payer: Self-pay | Admitting: Dentistry

## 2013-12-07 VITALS — BP 96/52 | HR 66 | Temp 98.5°F

## 2013-12-07 DIAGNOSIS — K0820 Unspecified atrophy of edentulous alveolar ridge: Secondary | ICD-10-CM

## 2013-12-07 DIAGNOSIS — K08109 Complete loss of teeth, unspecified cause, unspecified class: Secondary | ICD-10-CM

## 2013-12-07 DIAGNOSIS — R682 Dry mouth, unspecified: Secondary | ICD-10-CM

## 2013-12-07 DIAGNOSIS — K117 Disturbances of salivary secretion: Secondary | ICD-10-CM

## 2013-12-07 DIAGNOSIS — Z463 Encounter for fitting and adjustment of dental prosthetic device: Secondary | ICD-10-CM

## 2013-12-07 DIAGNOSIS — Z972 Presence of dental prosthetic device (complete) (partial): Secondary | ICD-10-CM

## 2013-12-07 DIAGNOSIS — K000 Anodontia: Principal | ICD-10-CM

## 2013-12-07 NOTE — Progress Notes (Signed)
12/07/2013  Patient:            Nancy Bates Date of Birth:  11-Jan-1941 MRN:                [id number]  BP 96/52  Pulse 66  Temp(Src) 98.5 F (36.9 C) (Oral)  YASHICA STERBENZ presents for evaluation of recently inserted lower complete denture along with previous upper denture. SUBJECTIVE: The patient is not complaining of any denture irritation involving the upper or lower dentures.. OBJECTIVE: There is no sign of denture irritation or erythema. Procedure: Pressure indicating paste was applied to the dentures. Adjustments were made as needed. [place] Occlusion evaluated and adjustments made as needed for Centric Relation and protrusive strokes. Patient accepts results.  Patient to keep dentures out if sore spots develop. Use salt water rinses as needed to aid healing. Return to clinic as scheduled for upper lab reline in one month. Call if problems arise before then.  Lenn Cal, DDS

## 2013-12-07 NOTE — Patient Instructions (Signed)
Keep dentures out if sore spots arise. Use salt water rinses as needed to aid healing. Return to clinic as scheduled for upper complete denture lab reline. Call if problems arise with denture irritation prior to that appointment. Dr. Enrique Sack

## 2013-12-31 ENCOUNTER — Encounter: Payer: Self-pay | Admitting: Radiation Oncology

## 2013-12-31 ENCOUNTER — Telehealth: Payer: Self-pay | Admitting: *Deleted

## 2013-12-31 ENCOUNTER — Ambulatory Visit
Admission: RE | Admit: 2013-12-31 | Discharge: 2013-12-31 | Disposition: A | Discharge status: Home / Self Care | Payer: Medicare Other | Source: Ambulatory Visit | Attending: Radiation Oncology | Admitting: Radiation Oncology

## 2013-12-31 VITALS — BP 111/58 | HR 75 | Temp 97.7°F | Resp 12 | Wt 105.2 lb

## 2013-12-31 DIAGNOSIS — C3410 Malignant neoplasm of upper lobe, unspecified bronchus or lung: Secondary | ICD-10-CM | POA: Diagnosis not present

## 2013-12-31 DIAGNOSIS — Z923 Personal history of irradiation: Secondary | ICD-10-CM | POA: Insufficient documentation

## 2013-12-31 DIAGNOSIS — Z880 Allergy status to penicillin: Secondary | ICD-10-CM | POA: Diagnosis not present

## 2013-12-31 DIAGNOSIS — Z79899 Other long term (current) drug therapy: Secondary | ICD-10-CM | POA: Diagnosis not present

## 2013-12-31 DIAGNOSIS — Z7982 Long term (current) use of aspirin: Secondary | ICD-10-CM | POA: Diagnosis not present

## 2013-12-31 LAB — BUN AND CREATININE (CC13)
BUN: 9.9 mg/dL (ref 7.0–26.0)
Creatinine: 0.7 mg/dL (ref 0.6–1.1)

## 2013-12-31 NOTE — Telephone Encounter (Signed)
CALLED PATIENT TO INFORM OF TEST AND FU, SPOKE WITH PATIENT'S DAUGHTER, CAROLYN SLADE AND THEY ARE AWARE OF THESE APPTS.

## 2013-12-31 NOTE — Progress Notes (Signed)
She is currently in no pain. Pt complains of occasional sore throat, Generalized Weakness. Shortness of Breath om exertion and Coughing which is occasionally producing thick yellow sputum. Pt is on room air. Pt denies dysphagia. The patient eats a regular, healthy diet.

## 2013-12-31 NOTE — Progress Notes (Signed)
  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  She is currently in no pain.  Pt complains of occasional sore throat, Generalized Weakness. Shortness of Breath om exertion and Coughing which is occasionally producing thick yellow sputum. Pt is on room air.  Pt denies dysphagia. The patient eats a regular, healthy diet                              ALLERGIES:  is allergic to penicillins.  Meds: Current Outpatient Prescriptions  Medication Sig Dispense Refill  . albuterol (PROVENTIL,VENTOLIN) 90 MCG/ACT inhaler Inhale 2 puffs into the lungs every 4 (four) hours as needed. For shortness of breath      . antiseptic oral rinse (BIOTENE) LIQD 15 mLs by Mouth Rinse route every 2 (two) hours as needed. For dry mouth      . aspirin 81 MG tablet Take 81 mg by mouth daily.        Marland Kitchen CALCIUM-VITAMIN D PO Take 1 tablet by mouth daily.      . Cholecalciferol (VITAMIN D3) 2000 UNITS capsule Take 2,000 Units by mouth daily.      . CRESTOR 10 MG tablet 10 mg daily.       Marland Kitchen HYDROcodone-acetaminophen (NORCO/VICODIN) 5-325 MG per tablet Take 1 tablet by mouth every 6 (six) hours as needed for moderate pain.  30 tablet  0  . levothyroxine (SYNTHROID) 50 MCG tablet Take 1 tablet (50 mcg total) by mouth daily before breakfast.  30 tablet  2  . loratadine (CLARITIN) 10 MG tablet Take 10 mg by mouth daily.        . sitaGLIPtin (JANUVIA) 100 MG tablet Take 100 mg by mouth daily.        Marland Kitchen tiotropium (SPIRIVA) 18 MCG inhalation capsule Place 18 mcg into inhaler and inhale daily.         No current facility-administered medications for this encounter.    Physical  Findings: The patient is in no acute distress. Patient is alert and oriented.  weight is 105 lb 3.2 oz (47.718 kg). Her oral temperature is 97.7 F (36.5 C). Her blood pressure is 111/58 and her pulse is 75. Her respiration is 12 and oxygen saturation is 100%. .  No significant changes.  Impression:  The patient is recovering from the effects of radiation.    Plan:  Post radiation chest CT in 2 weeks, then follow-up.  _____________________________________  Sheral Apley. Tammi Klippel, M.D.

## 2014-01-04 ENCOUNTER — Ambulatory Visit (HOSPITAL_COMMUNITY): Payer: Medicaid - Dental | Admitting: Dentistry

## 2014-01-04 ENCOUNTER — Encounter (HOSPITAL_COMMUNITY): Payer: Self-pay | Admitting: Dentistry

## 2014-01-04 VITALS — BP 104/50 | HR 68 | Temp 98.6°F

## 2014-01-04 DIAGNOSIS — Z463 Encounter for fitting and adjustment of dental prosthetic device: Secondary | ICD-10-CM

## 2014-01-04 DIAGNOSIS — K08109 Complete loss of teeth, unspecified cause, unspecified class: Secondary | ICD-10-CM

## 2014-01-04 DIAGNOSIS — K0889 Other specified disorders of teeth and supporting structures: Secondary | ICD-10-CM

## 2014-01-04 DIAGNOSIS — K000 Anodontia: Secondary | ICD-10-CM

## 2014-01-04 DIAGNOSIS — Z972 Presence of dental prosthetic device (complete) (partial): Principal | ICD-10-CM

## 2014-01-04 DIAGNOSIS — K0820 Unspecified atrophy of edentulous alveolar ridge: Secondary | ICD-10-CM

## 2014-01-04 NOTE — Progress Notes (Signed)
01/04/2014  Patient:            Nancy Bates Date of Birth:  1940/12/22 MRN:                [id number]  BP 104/50  Pulse 68  Temp(Src) 98.6 F (37 C) (Oral)  MYRTA MERCER presents for maxillary complete denture reline procedure and adjustment of lower denture. SUBJECTIVE: Patient is complaining of some mandibular anterior irritation. OBJECTIVE: There is no sign of denture irritation or erythema. 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 Procedure:  Pressure indicating paste applied lower denture. Denture adjusted as needed. [place] Patient accepts results. Upper denture border molding and final impression in Isofunctional compound. Very good retention stability obtained. Patient tolerated procedure well. To [place] for lab reline procedure.  Return to clinic for insertion of upper complete denture reline tomorrow.  Lenn Cal, DDS

## 2014-01-04 NOTE — Patient Instructions (Signed)
Return to clinic tomorrow for insertion of upper complete denture reline. Dr. Enrique Sack

## 2014-01-05 ENCOUNTER — Encounter (HOSPITAL_COMMUNITY): Payer: Self-pay | Admitting: Dentistry

## 2014-01-05 ENCOUNTER — Ambulatory Visit (HOSPITAL_COMMUNITY): Payer: Medicaid - Dental | Admitting: Dentistry

## 2014-01-05 VITALS — BP 107/53 | HR 72 | Temp 98.3°F

## 2014-01-05 DIAGNOSIS — K0820 Unspecified atrophy of edentulous alveolar ridge: Secondary | ICD-10-CM

## 2014-01-05 DIAGNOSIS — K08109 Complete loss of teeth, unspecified cause, unspecified class: Secondary | ICD-10-CM

## 2014-01-05 DIAGNOSIS — Z463 Encounter for fitting and adjustment of dental prosthetic device: Secondary | ICD-10-CM

## 2014-01-05 DIAGNOSIS — K0889 Other specified disorders of teeth and supporting structures: Secondary | ICD-10-CM

## 2014-01-05 DIAGNOSIS — Z972 Presence of dental prosthetic device (complete) (partial): Principal | ICD-10-CM

## 2014-01-05 DIAGNOSIS — K000 Anodontia: Secondary | ICD-10-CM

## 2014-01-05 NOTE — Patient Instructions (Signed)
Instructions for Denture Use and Care  Congratulations, you are on the way to oral rehabilitation!  You have just received a new set of complete or partial dentures.  These prostheses will help to improve both your appearance and chewing ability.  These instructions will help you get adjusted to your dentures as well as care for them properly.  Please read these instructions carefully and completely as soon as you get home.  If you or your caregiver have any questions please notify the [facility] at [phone].  HOW YOUR DENTURES LOOK AND FEEL Soon after you begin wearing your dentures, you may feel that your dentures are too large or even loose.  As our mouth and facial muscles become accustomed to the dentures, these feelings will go away.  You also may feel that you are salivating more than you normally do.  This feeling should go away as you get used to having the dentures in your mouth.  You may bite your cheek or your tongue; this will eventually resolve itself as you wear your dentures.  Some soreness is to be expected, but you should not hurt.  If your mouth hurts, call your dentist.  A denture adhesive may occasionally be necessary to hold your dentures in place more securely.  The dentist will let you know when one is recommended for you.  SPEAKING Wearing dentures will change the sound of your voice initially.  This will be noticed by you more than anyone else.  Bite and swallow before you speak, in order to place your dentures in position so that you may speak more clearly.  Practice speaking by reading aloud or counting from 1 to 100 very slowly and distinctly.  After some practice your mouth will become accustomed to your dentures and you will speak more clearly.  EATING Chewing will definitely be different after you receive your dentures.  With a little practice and patience you should be able to eat just about any kind of food.  Begin by eating small quantities of food  that are cut into small pieces.  Star with soft foods such as eggs, cooked vegetables, or puddings.  As you gain confidence advance  Your diet to whatever texture foods you can tolerate.  DENTURE CARE Dentures can collect plaque and calculus much the same as natural teeth can.  If not removed on a regular basis, your dentures will not look or feel clean, and you will experience denture odor.  It is very important that you remove your dentures at bedtime and clean them thoroughly.  You should: 1. Clean your dentures over a sink full of water so if dropped, breakage will be prevented. 2. Rinse your dentures with cool water to remove any large food particles. 3. Use soap and water or a denture cleanser or paste to clean the dentures.  Do not use regular toothpaste as it may abrade the denture base or teeth. 4. Use a moistened denture brush to clean all surfaces (inside and outside). 5. Rinse thoroughly to remove any remaining soap or denture cleanser. 6. Use a soft bristle toothbrush to gently brush any natural teeth, gums, tongue, and palate at bedtime and before reinserting your dentures. 7. Do not sleep with your dentures in your mouth at night.  Remove your dentures and soak them overnight in a denture cup filled with water or denture solution as recommended by your dentist.  This routine will become second nature and will increase the life and comfort  of your dentures.  Please do not try to adjust these dentures yourself; you could damage them.  FOLLOW-UP You should call or make an appointment with your dentist.  Your dentist would like to see you at least once a year for a check-up and examination.

## 2014-01-05 NOTE — Progress Notes (Signed)
01/05/2014  Patient:            Nancy Bates Date of Birth:  [birth year] MRN:                [id number]  BP 107/53  Pulse 72  Temp(Src) 98.3 F (36.8 C) (Oral)  Nancy Bates presents for insertion of upper complete denture reline with evaluation of lower denture of needed. Procedure: Pressure indicating paste was applied to the dentures. Adjustments were made as needed. [place] Occlusion evaluated and adjustments made as needed for Centric Relation and protrusive strokes. Good esthetics, phonetics, fit, and function noted. Patient accepts results. Post op instructions provided in written and verbal formats on use and care of dentures. Gave patient denture cup. Patient to keep dentures out if sore spots develop. Use salt water rinses as needed to aid healing. Return to clinic as scheduled for denture adjustment.  Call if problems arise before then. Patient dismissed in stable condition.  Lenn Cal, DDS

## 2014-01-13 ENCOUNTER — Encounter (HOSPITAL_COMMUNITY): Payer: Self-pay | Admitting: Dentistry

## 2014-01-13 ENCOUNTER — Ambulatory Visit (HOSPITAL_COMMUNITY)
Admission: RE | Admit: 2014-01-13 | Discharge: 2014-01-13 | Disposition: A | Discharge status: Home / Self Care | Payer: Medicare Other | Source: Ambulatory Visit | Attending: Radiation Oncology | Admitting: Radiation Oncology

## 2014-01-13 ENCOUNTER — Encounter (HOSPITAL_COMMUNITY): Payer: Self-pay

## 2014-01-13 DIAGNOSIS — K449 Diaphragmatic hernia without obstruction or gangrene: Secondary | ICD-10-CM | POA: Diagnosis not present

## 2014-01-13 DIAGNOSIS — C3490 Malignant neoplasm of unspecified part of unspecified bronchus or lung: Secondary | ICD-10-CM | POA: Diagnosis present

## 2014-01-13 DIAGNOSIS — R05 Cough: Secondary | ICD-10-CM | POA: Diagnosis not present

## 2014-01-13 DIAGNOSIS — R222 Localized swelling, mass and lump, trunk: Secondary | ICD-10-CM | POA: Insufficient documentation

## 2014-01-13 DIAGNOSIS — Z923 Personal history of irradiation: Secondary | ICD-10-CM | POA: Insufficient documentation

## 2014-01-13 DIAGNOSIS — J90 Pleural effusion, not elsewhere classified: Secondary | ICD-10-CM | POA: Insufficient documentation

## 2014-01-13 DIAGNOSIS — Z9221 Personal history of antineoplastic chemotherapy: Secondary | ICD-10-CM | POA: Diagnosis not present

## 2014-01-13 DIAGNOSIS — R059 Cough, unspecified: Secondary | ICD-10-CM | POA: Diagnosis not present

## 2014-01-13 DIAGNOSIS — E279 Disorder of adrenal gland, unspecified: Secondary | ICD-10-CM | POA: Diagnosis not present

## 2014-01-13 DIAGNOSIS — C3410 Malignant neoplasm of upper lobe, unspecified bronchus or lung: Secondary | ICD-10-CM

## 2014-01-13 MED ORDER — IOHEXOL 300 MG/ML  SOLN
80.0000 mL | Freq: Once | INTRAMUSCULAR | Status: AC | PRN
  Administered 2014-01-13: 80 mL via INTRAVENOUS

## 2014-01-14 ENCOUNTER — Ambulatory Visit (HOSPITAL_COMMUNITY): Payer: Medicaid - Dental | Admitting: Dentistry

## 2014-01-14 ENCOUNTER — Encounter (INDEPENDENT_AMBULATORY_CARE_PROVIDER_SITE_OTHER): Payer: Self-pay

## 2014-01-14 ENCOUNTER — Encounter (HOSPITAL_COMMUNITY): Payer: Self-pay | Admitting: Dentistry

## 2014-01-14 ENCOUNTER — Ambulatory Visit
Admission: RE | Admit: 2014-01-14 | Discharge: 2014-01-14 | Disposition: A | Discharge status: Home / Self Care | Payer: Medicare Other | Source: Ambulatory Visit | Attending: Radiation Oncology | Admitting: Radiation Oncology

## 2014-01-14 ENCOUNTER — Encounter: Payer: Self-pay | Admitting: Radiation Oncology

## 2014-01-14 VITALS — BP 106/66 | HR 79 | Temp 98.3°F | Resp 18 | Wt 104.7 lb

## 2014-01-14 VITALS — BP 109/63 | HR 76 | Temp 99.0°F

## 2014-01-14 DIAGNOSIS — K0820 Unspecified atrophy of edentulous alveolar ridge: Secondary | ICD-10-CM

## 2014-01-14 DIAGNOSIS — K08109 Complete loss of teeth, unspecified cause, unspecified class: Secondary | ICD-10-CM

## 2014-01-14 DIAGNOSIS — Z972 Presence of dental prosthetic device (complete) (partial): Secondary | ICD-10-CM

## 2014-01-14 DIAGNOSIS — K000 Anodontia: Principal | ICD-10-CM

## 2014-01-14 DIAGNOSIS — C3410 Malignant neoplasm of upper lobe, unspecified bronchus or lung: Secondary | ICD-10-CM

## 2014-01-14 DIAGNOSIS — C12 Malignant neoplasm of pyriform sinus: Secondary | ICD-10-CM

## 2014-01-14 DIAGNOSIS — Z463 Encounter for fitting and adjustment of dental prosthetic device: Secondary | ICD-10-CM

## 2014-01-14 NOTE — Patient Instructions (Signed)
Patient to keep dentures out if sore spots develop. Use salt water rinses as needed to aid healing. Return to clinic as scheduled for denture adjustment.   Call if problems arise before then.  Lenn Cal, DDS

## 2014-01-14 NOTE — Progress Notes (Signed)
01/14/2014  Patient:            Nancy Bates Date of Birth:  09-18-1940 MRN:                [id number]  BP 109/63  Pulse 76  Temp(Src) 99 F (37.2 C) (Oral)  Nancy Bates presents for evaluation of recently inserted upper complete denture reline with evaluation of lower denture if needed. SUBJECTIVE: The patient is not complaining of any upper denture irritation. OBJECTIVE: There is no sign of denture irritation or erythema. Procedure: Pressure indicating paste was applied to the dentures. Adjustments were made as needed. [place] Occlusion evaluated and adjustments made as needed for Centric Relation and protrusive strokes. Patient accepts results. Patient to keep dentures out if sore spots develop. Use salt water rinses as needed to aid healing. Return to clinic as scheduled for denture adjustment.   Call if problems arise before then.  Lenn Cal, DDS

## 2014-01-14 NOTE — Progress Notes (Signed)
Patient here for routine follow up completion of radiation to left upper lung on 11/12/13.denies pain.Shortness of breath at times.To review ct of chest results 01/13/14.decreasein size of mass.

## 2014-01-14 NOTE — Progress Notes (Signed)
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 Rolena Infante to 70 Gy in33 fractions.  09/21/2013-11/12/2013  The left upper lung mass was treated to 66 Gy in 33 fractions   Interval Since Last Radiation:  2  months  Narrative:  The patient returns today for routine follow-up.  Patient here for routine follow up completion of radiation to left upper lung on 11/12/13.denies pain.Shortness of breath at times.To review ct of chest results 01/13/14.decreasein size of mass                             ALLERGIES:  is allergic to penicillins.  Meds: Current Outpatient Prescriptions  Medication Sig Dispense Refill  . albuterol (PROVENTIL,VENTOLIN) 90 MCG/ACT inhaler Inhale 2 puffs into the lungs every 4 (four) hours as needed. For shortness of breath      . antiseptic oral rinse (BIOTENE) LIQD 15 mLs by Mouth Rinse route every 2 (two) hours as needed. For dry mouth      . aspirin 81 MG tablet Take 81 mg by mouth daily.        Marland Kitchen CALCIUM-VITAMIN D PO Take 1 tablet by mouth daily.      . Cholecalciferol (VITAMIN D3) 2000 UNITS capsule Take 2,000 Units by mouth daily.      . CRESTOR 10 MG tablet 10 mg daily.       Marland Kitchen levothyroxine (SYNTHROID) 50 MCG tablet Take 1 tablet (50 mcg total) by mouth daily before breakfast.  30 tablet  2  . loratadine (CLARITIN) 10 MG tablet Take 10 mg by mouth daily.        . sitaGLIPtin (JANUVIA) 100 MG tablet Take 100 mg by mouth daily.        Marland Kitchen tiotropium (SPIRIVA) 18 MCG inhalation capsule Place 18 mcg into inhaler and inhale daily.        Marland Kitchen HYDROcodone-acetaminophen (NORCO/VICODIN) 5-325 MG per tablet Take 1 tablet by mouth every 6 (six) hours as needed for  moderate pain.  30 tablet  0   No current facility-administered medications for this encounter.    Physical Findings: The patient is in no acute distress. Patient is alert and oriented.  weight is 104 lb 11.2 oz (47.492 kg). Her temperature is 98.3 F (36.8 C). Her blood pressure is 106/66 and her pulse is 79. Her respiration is 18 and oxygen saturation is 98%. .  No significant changes.  Lab Findings: Lab Results  Component Value Date   WBC 7.5 08/25/2013   HGB 10.7* 08/25/2013   HCT 33.0* 08/25/2013   MCV 76.6* 08/25/2013   PLT 355 08/25/2013    @LASTCHEM @  Radiographic Findings: Ct Chest W Contrast  01/13/2014   CLINICAL DATA:  Head neck cancer, lung cancer, chemotherapy and radiation therapy complete. Cough.  EXAM: CT CHEST WITH CONTRAST  TECHNIQUE: Multidetector CT imaging of the chest was performed during intravenous contrast administration.  CONTRAST:  20mL OMNIPAQUE IOHEXOL 300 MG/ML  SOLN  COMPARISON:  PET 09/03/2013 and CT chest 08/11/2013. PET 07/07/2009.  FINDINGS: No pathologically enlarged mediastinal, right hilar or axillary lymph nodes. Surgical clips are seen in the  right axilla. Heart is at the upper limits of normal in size to mildly enlarged. Coronary artery calcification. No pericardial effusion. Moderate hiatal hernia.  Tiny left pleural effusion, new. Heterogeneous left upper lobe mass is again seen. Left upper lobe mass has decreased slightly in size from 09/03/2013, measuring 4.2 x 5.9 cm (image 18), previously 4.6 x 6.8 cm. Surrounding ground-glass, architectural distortion and bronchiectasis are new. Associated mass effect on the distal left main pulmonary artery and its branches (example series 2, image 21).  Linear scarring in the apical segment right upper lobe with associated volume loss and scattered bronchiectasis. Subpleural ground-glass in the superior segment right lower lobe (series 5, image 25) is nonspecific.  Incidental imaging of the upper abdomen shows the  visualized portions of the liver, gallbladder and right adrenal gland to be unremarkable. 7 mm left adrenal nodule or thickening, stable from 07/07/2009. Spleen, pancreas, stomach and bowel are otherwise grossly unremarkable. No upper abdominal adenopathy.  No worrisome lytic or sclerotic lesions.  IMPRESSION: 1. Slight decrease in size of a left upper lobe mass with surrounding changes of radiation therapy. Associated mass effect on the distal left main pulmonary artery and its branches. 2. 7 mm left adrenal nodule or thickening, unchanged from 07/07/2009. 3. Tiny left pleural effusion, new. 4. Coronary artery calcification. 5. Moderate hiatal hernia.   Electronically Signed   By: Lorin Picket M.D.   On: 01/13/2014 10:06    Impression:  The patient is recovering from the effects of radiation.  She has a favorable response to radiation.  Plan:  Consider Chest CT in 6 months then follow-up.  _____________________________________  Sheral Apley. Tammi Klippel, M.D.

## 2014-01-15 ENCOUNTER — Telehealth: Payer: Self-pay | Admitting: *Deleted

## 2014-01-15 NOTE — Telephone Encounter (Signed)
CALLED PATIENT TO INFORM OF CT ON 07-15-14 AND HER FU VISIT WITH DR. MANNING ON 07-22-14, SPOKE WITH PATIENT'S DAUGHTER- CAROLYN SLADE AND SHE IS AWARE OF THESE APPTS.

## 2014-02-15 ENCOUNTER — Encounter (HOSPITAL_COMMUNITY): Payer: Self-pay | Admitting: Dentistry

## 2014-02-15 ENCOUNTER — Ambulatory Visit (HOSPITAL_COMMUNITY): Payer: Medicaid - Dental | Admitting: Dentistry

## 2014-02-15 ENCOUNTER — Encounter (INDEPENDENT_AMBULATORY_CARE_PROVIDER_SITE_OTHER): Payer: Self-pay

## 2014-02-15 VITALS — BP 94/54 | HR 69 | Temp 98.7°F

## 2014-02-15 DIAGNOSIS — K08109 Complete loss of teeth, unspecified cause, unspecified class: Secondary | ICD-10-CM

## 2014-02-15 DIAGNOSIS — K117 Disturbances of salivary secretion: Secondary | ICD-10-CM

## 2014-02-15 DIAGNOSIS — Z463 Encounter for fitting and adjustment of dental prosthetic device: Secondary | ICD-10-CM

## 2014-02-15 DIAGNOSIS — R682 Dry mouth, unspecified: Secondary | ICD-10-CM

## 2014-02-15 DIAGNOSIS — Z972 Presence of dental prosthetic device (complete) (partial): Secondary | ICD-10-CM

## 2014-02-15 DIAGNOSIS — K000 Anodontia: Principal | ICD-10-CM

## 2014-02-15 DIAGNOSIS — K0820 Unspecified atrophy of edentulous alveolar ridge: Secondary | ICD-10-CM

## 2014-02-15 NOTE — Progress Notes (Signed)
02/15/2014  Patient:            Nancy Bates Date of Birth:  1940/08/09 MRN:                [id number]  BP 94/54  Pulse 69  Temp(Src) 98.7 F (37.1 C) (Oral)  ALLIAH BOULANGER presents for evaluation of recently inserted upper complete denture reline with evaluation of lower denture if needed. SUBJECTIVE: The patient is not complaining of any upper or lower denture irritation. Patient indicates that she primarily wears the dentures when she goes to church. Patient was reminded to keep dentures in water when out of her mouth. OBJECTIVE: There is no sign of denture irritation or erythema. Procedure: Pressure indicating paste was applied to the dentures. Adjustments were made as needed. [place] Occlusion evaluated and adjustments made as needed for Centric Relation and protrusive strokes. Patient accepts results. Patient to keep dentures out if sore spots develop. Use salt water rinses as needed to aid healing. Return to clinic as scheduled for denture adjustment.   Call if problems arise before then.  Lenn Cal, DDS

## 2014-02-15 NOTE — Patient Instructions (Signed)
Patient to keep dentures out if sore spots develop. Use salt water rinses as needed to aid healing. Return to clinic as scheduled for denture adjustment.   Call if problems arise before then.  Lenn Cal, DDS

## 2014-05-20 ENCOUNTER — Emergency Department (HOSPITAL_COMMUNITY)
Admission: EM | Admit: 2014-05-20 | Discharge: 2014-05-20 | Disposition: A | Discharge status: Home / Self Care | Payer: Medicare Other | Attending: Emergency Medicine | Admitting: Emergency Medicine

## 2014-05-20 ENCOUNTER — Encounter (HOSPITAL_COMMUNITY): Payer: Self-pay | Admitting: *Deleted

## 2014-05-20 ENCOUNTER — Emergency Department (HOSPITAL_COMMUNITY): Payer: Medicare Other

## 2014-05-20 DIAGNOSIS — J449 Chronic obstructive pulmonary disease, unspecified: Secondary | ICD-10-CM | POA: Insufficient documentation

## 2014-05-20 DIAGNOSIS — E119 Type 2 diabetes mellitus without complications: Secondary | ICD-10-CM | POA: Diagnosis not present

## 2014-05-20 DIAGNOSIS — Z85118 Personal history of other malignant neoplasm of bronchus and lung: Secondary | ICD-10-CM | POA: Insufficient documentation

## 2014-05-20 DIAGNOSIS — R531 Weakness: Secondary | ICD-10-CM | POA: Diagnosis present

## 2014-05-20 DIAGNOSIS — E860 Dehydration: Secondary | ICD-10-CM

## 2014-05-20 DIAGNOSIS — Z7982 Long term (current) use of aspirin: Secondary | ICD-10-CM | POA: Diagnosis not present

## 2014-05-20 DIAGNOSIS — E039 Hypothyroidism, unspecified: Secondary | ICD-10-CM | POA: Diagnosis not present

## 2014-05-20 DIAGNOSIS — E46 Unspecified protein-calorie malnutrition: Secondary | ICD-10-CM | POA: Insufficient documentation

## 2014-05-20 DIAGNOSIS — Z880 Allergy status to penicillin: Secondary | ICD-10-CM | POA: Diagnosis not present

## 2014-05-20 DIAGNOSIS — R55 Syncope and collapse: Secondary | ICD-10-CM | POA: Insufficient documentation

## 2014-05-20 DIAGNOSIS — Z87891 Personal history of nicotine dependence: Secondary | ICD-10-CM | POA: Diagnosis not present

## 2014-05-20 DIAGNOSIS — Z79899 Other long term (current) drug therapy: Secondary | ICD-10-CM | POA: Insufficient documentation

## 2014-05-20 DIAGNOSIS — Z8522 Personal history of malignant neoplasm of nasal cavities, middle ear, and accessory sinuses: Secondary | ICD-10-CM | POA: Insufficient documentation

## 2014-05-20 LAB — URINALYSIS, ROUTINE W REFLEX MICROSCOPIC
Bilirubin Urine: NEGATIVE
Glucose, UA: NEGATIVE mg/dL
KETONES UR: NEGATIVE mg/dL
Nitrite: NEGATIVE
Protein, ur: NEGATIVE mg/dL
Specific Gravity, Urine: 1.016 (ref 1.005–1.030)
UROBILINOGEN UA: 0.2 mg/dL (ref 0.0–1.0)
pH: 6.5 (ref 5.0–8.0)

## 2014-05-20 LAB — COMPREHENSIVE METABOLIC PANEL
ALK PHOS: 51 U/L (ref 39–117)
ALT: 10 U/L (ref 0–35)
AST: 23 U/L (ref 0–37)
Albumin: 3.2 g/dL — ABNORMAL LOW (ref 3.5–5.2)
Anion gap: 16 — ABNORMAL HIGH (ref 5–15)
BILIRUBIN TOTAL: 0.5 mg/dL (ref 0.3–1.2)
BUN: 8 mg/dL (ref 6–23)
CO2: 22 mmol/L (ref 19–32)
Calcium: 9.5 mg/dL (ref 8.4–10.5)
Chloride: 101 mEq/L (ref 96–112)
Creatinine, Ser: 0.84 mg/dL (ref 0.50–1.10)
GFR calc non Af Amer: 66 mL/min — ABNORMAL LOW (ref >90)
GFR, EST AFRICAN AMERICAN: 77 mL/min — AB (ref >90)
Glucose, Bld: 100 mg/dL — ABNORMAL HIGH (ref 70–99)
Potassium: 3.8 mmol/L (ref 3.5–5.1)
Sodium: 139 mmol/L (ref 135–145)
Total Protein: 8.2 g/dL (ref 6.0–8.3)

## 2014-05-20 LAB — CBC WITH DIFFERENTIAL/PLATELET
Basophils Absolute: 0 10*3/uL (ref 0.0–0.1)
Basophils Relative: 0 % (ref 0–1)
Eosinophils Absolute: 0.1 10*3/uL (ref 0.0–0.7)
Eosinophils Relative: 1 % (ref 0–5)
HCT: 36.1 % (ref 36.0–46.0)
Hemoglobin: 11.9 g/dL — ABNORMAL LOW (ref 12.0–15.0)
Lymphocytes Relative: 13 % (ref 12–46)
Lymphs Abs: 0.9 10*3/uL (ref 0.7–4.0)
MCH: 25.1 pg — AB (ref 26.0–34.0)
MCHC: 33 g/dL (ref 30.0–36.0)
MCV: 76 fL — ABNORMAL LOW (ref 78.0–100.0)
Monocytes Absolute: 1 10*3/uL (ref 0.1–1.0)
Monocytes Relative: 14 % — ABNORMAL HIGH (ref 3–12)
Neutro Abs: 5.2 10*3/uL (ref 1.7–7.7)
Neutrophils Relative %: 72 % (ref 43–77)
Platelets: 249 10*3/uL (ref 150–400)
RBC: 4.75 MIL/uL (ref 3.87–5.11)
RDW: 17.9 % — ABNORMAL HIGH (ref 11.5–15.5)
WBC: 7.2 10*3/uL (ref 4.0–10.5)

## 2014-05-20 LAB — URINE MICROSCOPIC-ADD ON

## 2014-05-20 LAB — TROPONIN I: Troponin I: <0.03 ng/mL (ref <0.031)

## 2014-05-20 MED ORDER — SODIUM CHLORIDE 0.9 % IV BOLUS (SEPSIS)
500.0000 mL | Freq: Once | INTRAVENOUS | Status: AC
  Administered 2014-05-20: 500 mL via INTRAVENOUS

## 2014-05-20 MED ORDER — SODIUM CHLORIDE 0.9 % IV SOLN
INTRAVENOUS | Status: DC
Start: 2014-05-20 — End: 2014-05-20
  Administered 2014-05-20: 09:00:00 via INTRAVENOUS

## 2014-05-20 NOTE — ED Provider Notes (Signed)
CSN: [id number]     Arrival date & time 05/20/14  6295 History   None    Chief Complaint  Patient presents with  . Weakness     (Consider location/radiation/quality/duration/timing/severity/associated sxs/prior Treatment) HPI   Nancy Bates is a 74 y.o. female who is here for evaluation of near syncope.  Her aide was with her, this morning, when she started to collapse to the floor.  She was assessed to floor, and did not fall.  She saw her doctor 4 days ago and at that time was told that she was dehydrated.  Her doctor recommended that she drink "boost", and as much water as possible.  She denies recent fever, chills, nausea, vomiting, diarrhea, chest pain or localized weakness.  There's been no urinary frequency.  He did have an episode of urinary incontinence, tonight.  There are no other known modifying factors.   Past Medical History  Diagnosis Date  . COPD (chronic obstructive pulmonary disease)   . Hyperlipidemia   . Hypothyroidism   . Diabetes mellitus   . 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 Status post chemotherapy comp. 12/20/08    Cisplatin q 3 weeks Concurrent with Radiation  Therapy  . 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 Cancer     sinus and neck ca  . Microcytosis 03/24/2013  . Weight loss 08/03/2013  . 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 Hiatal hernia repair    . Right ankle      S/p Fracture  . Direct laryngoscopy  07/07/08  . Debulking  07/07/08    Laser Debulking during direct Laryngoscopy  - Squmaous cell of the Pyriform Sinus   . Multiple extractions with alveoloplasty N/A 08/27/2013    Procedure: Extraction of tooth #'s 20,21,22,23,24,25,26,27,28 with alveoloplasty;  Surgeon: Lenn Cal, DDS;  Location: [facility];  Service: Oral Surgery;  Laterality: N/A;  . Lung biopsy Left  08/24/13    invasive squamous cell   Family History  Problem Relation Age of Onset  . Stroke Mother   . Hypertension Mother   . Cancer Sister     stomach  . Diabetes Sister    History  Substance Use Topics  . Smoking status: Former Smoker -- 3.00 packs/day for 50 years    Types: Cigarettes    Quit date: 04/11/2001  . Smokeless tobacco: Never Used     Comment: quit 2007?  Marland Kitchen Alcohol Use: No     Comment: hx of alcohol-quit 2005   OB History    No data available     Review of Systems  All other systems reviewed and are negative.     Allergies  Penicillins  Home Medications   Prior to Admission medications   Medication Sig Start Date End Date Taking? Authorizing Provider  antiseptic oral rinse (BIOTENE) LIQD 15 mLs by Mouth Rinse route every 2 (two) hours as needed. For dry mouth   Yes Historical Provider, MD  aspirin 81 MG tablet Take 81 mg by mouth daily.     Yes Historical Provider, MD  CALCIUM-VITAMIN D PO Take 1 tablet by mouth daily.   Yes Historical Provider, MD  Cholecalciferol (VITAMIN D3) 2000 UNITS capsule Take 2,000 Units by mouth daily.   Yes Historical Provider, MD  CRESTOR 10 MG tablet Take  10 mg by mouth daily.  06/05/12  Yes Historical Provider, MD  levothyroxine (SYNTHROID) 50 MCG tablet Take 1 tablet (50 mcg total) by mouth daily before breakfast. 01/22/13  Yes Maryanna Shape, NP  loratadine (CLARITIN) 10 MG tablet Take 10 mg by mouth daily.     Yes Historical Provider, MD  ranitidine (ZANTAC) 150 MG tablet Take 150 mg by mouth daily. 05/17/14  Yes Historical Provider, MD  sitaGLIPtin (JANUVIA) 100 MG tablet Take 100 mg by mouth daily.     Yes Historical Provider, MD  tiotropium (SPIRIVA) 18 MCG inhalation capsule Place 18 mcg into inhaler and inhale daily.     Yes Historical Provider, MD  albuterol (PROVENTIL,VENTOLIN) 90 MCG/ACT inhaler Inhale 2 puffs into the lungs every 4 (four) hours as needed. For shortness of breath    Historical Provider, MD   HYDROcodone-acetaminophen (NORCO/VICODIN) 5-325 MG per tablet Take 1 tablet by mouth every 6 (six) hours as needed for moderate pain. Patient not taking: Reported on 05/20/2014 08/27/13   Lenn Cal, DDS   BP 121/58 mmHg  Pulse 77  Temp(Src) 97.7 F (36.5 C) (Oral)  Resp 18  Ht 5\' 8"  (1.727 m)  Wt 100 lb 6.4 oz (45.541 kg)  BMI 15.27 kg/m2  SpO2 98% Physical Exam  Constitutional: She is oriented to person, place, and time. She appears well-developed.  Elderly, frail  HENT:  Head: Normocephalic and atraumatic.  Right Ear: External ear normal.  Left Ear: External ear normal.  Mucous members are somewhat dry.  Eyes: Conjunctivae and EOM are normal. Pupils are equal, round, and reactive to light.  Neck: Normal range of motion and phonation normal. Neck supple.  Cardiovascular: Normal rate, regular rhythm and normal heart sounds.   Pulmonary/Chest: Effort normal and breath sounds normal. She exhibits no bony tenderness.  Abdominal: Soft. There is no tenderness.  Musculoskeletal: Normal range of motion.  Neurological: She is alert and oriented to person, place, and time. No cranial nerve deficit or sensory deficit. She exhibits normal muscle tone. Coordination normal.  Skin: Skin is warm, dry and intact.  Psychiatric: She has a normal mood and affect. Her behavior is normal. Judgment and thought content normal.  Nursing note and vitals reviewed.   ED Course  Procedures (including critical care time)  Medications  sodium chloride 0.9 % bolus 500 mL (0 mLs Intravenous Stopped 05/20/14 0839)  sodium chloride 0.9 % bolus 500 mL (0 mLs Intravenous Stopped 05/20/14 1400)    Patient Vitals for the past 24 hrs:  BP Temp Temp src Pulse Resp SpO2 Height Weight  05/20/14 1515 121/58 mmHg - - 77 - 98 % - -  05/20/14 1500 102/59 mmHg - - 69 - 99 % - -  05/20/14 1445 (!) 105/52 mmHg - - 68 - 98 % - -  05/20/14 1430 (!) 110/45 mmHg - - 69 - 98 % - -  05/20/14 1415 113/61 mmHg - - 68 -  99 % - -  05/20/14 1400 105/60 mmHg - - 66 - 99 % - -  05/20/14 1345 113/73 mmHg - - 64 - 99 % - -  05/20/14 1330 (!) 119/53 mmHg - - 66 - 99 % - -  05/20/14 1315 103/62 mmHg - - 70 - 98 % - -  05/20/14 1300 128/76 mmHg - - 86 - 97 % - -  05/20/14 1230 115/90 mmHg - - 66 - 98 % - -  05/20/14 1215 106/64 mmHg - - 70 - 100 % - -  05/20/14 1200 (!) 100/52 mmHg - - 70 - 98 % - -  05/20/14 1145 102/65 mmHg - - 78 - 97 % - -  05/20/14 1130 103/57 mmHg - - 70 - 97 % - -  05/20/14 1115 (!) 111/52 mmHg - - 78 - 99 % - -  05/20/14 1030 (!) 107/54 mmHg - - 72 18 99 % - -  05/20/14 1000 (!) 111/46 mmHg - - 72 18 99 % - -  05/20/14 0900 (!) 119/53 mmHg - - 85 21 99 % - -  05/20/14 0845 113/57 mmHg - - 75 18 97 % - -  05/20/14 0617 - - - - - 100 % - -  05/20/14 0615 114/57 mmHg - - 79 - 100 % - -  05/20/14 1829 (!) 108/49 mmHg 97.7 F (36.5 C) Oral 63 16 95 % 5\' 8"  (1.727 m) 100 lb 6.4 oz (45.541 kg)       Labs Review Labs Reviewed  CBC WITH DIFFERENTIAL - Abnormal; Notable for the following:    Hemoglobin 11.9 (*)    MCV 76.0 (*)    MCH 25.1 (*)    RDW 17.9 (*)    Monocytes Relative 14 (*)    All other components within normal limits  COMPREHENSIVE METABOLIC PANEL - Abnormal; Notable for the following:    Glucose, Bld 100 (*)    Albumin 3.2 (*)    GFR calc non Af Amer 66 (*)    GFR calc Af Amer 77 (*)    Anion gap 16 (*)    All other components within normal limits  URINALYSIS, ROUTINE W REFLEX MICROSCOPIC - Abnormal; Notable for the following:    Hgb urine dipstick TRACE (*)    Leukocytes, UA SMALL (*)    All other components within normal limits  URINE MICROSCOPIC-ADD ON - Abnormal; Notable for the following:    Bacteria, UA FEW (*)    Casts HYALINE CASTS (*)    All other components within normal limits  URINE CULTURE  TROPONIN I    Imaging Review Dg Chest 2 View  05/20/2014   CLINICAL DATA:  Generalized weakness. History of left upper lung squamous cell carcinoma status  post radiation therapy.  EXAM: CHEST  2 VIEW  COMPARISON:  Chest CT 01/13/2014 and radiograph 08/24/2013  FINDINGS: Cardiac silhouette is within normal limits for size. Hiatal hernia is again seen. There is progressive confluent opacification of the left upper lobe at the apex with associated volume loss. Left lower lobe is clear. Small densities in the right upper lobe are unchanged from prior radiographs and may reflect scarring. Remainder of the right lung is clear. There may be a trace left pleural effusion. Right axillary surgical clips are noted. Chronic right third rib deformity is unchanged.  IMPRESSION: Increasing opacity and volume loss in the left upper lobe, likely progressive radiation change.   Electronically Signed   By: Logan Bores   On: 05/20/2014 08:21     EKG Interpretation   Date/Time:  Thursday May 20 2014 07:43:01 EST Ventricular Rate:  65 PR Interval:  192 QRS Duration: 87 QT Interval:  422 QTC Calculation: 439 R Axis:   93 Text Interpretation:  Sinus rhythm Multiple ventricular premature  complexes Right axis deviation ST elevation, consider inferior injury  Non-specific ST-t changes are new Confirmed by Eulis Foster  MD, Lalitha Ilyas ([id number])  on 05/21/2014 12:46:45 AM      MDM   Final diagnoses:  Weakness  Dehydration  Protein  calorie malnutrition    Near-syncope, with ongoing poor oral nutrition.  Doubt frank syncope.  No evidence for ACS.  Or severe hypertension.  Plan- care to Dr. Alvino Chapel to evaluate after return of labs and consider discharge versus admission     Richarda Blade, MD 05/21/14 [phone]

## 2014-05-20 NOTE — ED Notes (Signed)
Pt arrives from home via [facility]. Per EMS pts family states pt has become very weak while assisting to the bathroom. Pt had urine incontinence episode. Pt was assisted to the floor by the son. No trauma or apparent injuries. Per EMS pts baseline speech is difficult to understand. Pt c/o head feeling numb. EMS gave 300cc of NS. 22G LFA. 152 CBG 110/68 74 16.

## 2014-05-20 NOTE — ED Provider Notes (Signed)
  Physical Exam  BP 121/58 mmHg  Pulse 77  Temp(Src) 97.7 F (36.5 C) (Oral)  Resp 18  Ht 5\' 8"  (1.727 m)  Wt 100 lb 6.4 oz (45.541 kg)  BMI 15.27 kg/m2  SpO2 98%  Physical Exam  ED Course  Procedures  MDM Patient has received a liter of fluid IV and has had oral intake. States she feels better. Will discharge home. Will follow with her PCP      Jasper Riling. Alvino Chapel, MD 05/20/14 1526

## 2014-05-20 NOTE — ED Notes (Signed)
Meal tray ordered 

## 2014-05-20 NOTE — Discharge Instructions (Signed)
Dehydration Dehydration is when you lose more fluids from the body than you take in. Vital organs such as the kidneys, brain, and heart cannot function without a proper amount of fluids and salt. Any loss of fluids from the body can cause dehydration.  Older adults are at a higher risk of dehydration than younger adults. As we age, our bodies are less able to conserve water and do not respond to temperature changes as well. Also, older adults do not become thirsty as easily or quickly. Because of this, older adults often do not realize they need to increase fluids to avoid dehydration.  CAUSES   Vomiting.  Diarrhea.  Excessive sweating.  Excessive urination.  Fever.  Certain medicines, such as blood pressure medicines called diuretics.  Poorly controlled blood sugars. SIGNS AND SYMPTOMS  Mild dehydration:  Thirst.  Dry lips.  Slightly dry mouth. Moderate dehydration:  Very dry mouth.  Sunken eyes.  Skin does not bounce back quickly when lightly pinched and released.  Dark urine and decreased urine production.  Decreased tear production.  Headache. Severe dehydration:  Very dry mouth.  Extreme thirst.  Rapid, weak pulse (more than 100 beats per minute at rest).  Cold hands and feet.  Not able to sweat in spite of heat.  Rapid breathing.  Blue lips.  Confusion and lethargy.  Difficulty being awakened.  Minimal urine production.  No tears. DIAGNOSIS  Your health care provider will diagnose dehydration based on your symptoms and your exam. Blood and urine tests will help confirm the diagnosis. The diagnostic evaluation should also identify the cause of dehydration. TREATMENT  Treatment of mild or moderate dehydration can often be done at home by increasing the amount of fluids that you drink. It is best to drink small amounts of fluid more often. Drinking too much at one time can make vomiting worse. Severe dehydration needs to be treated at the hospital.  You may be given IV fluids that contain water and electrolytes. HOME CARE INSTRUCTIONS   Ask your health care provider about specific rehydration instructions.  Drink enough fluids to keep your urine clear or pale yellow.  Drink small amounts frequently if you have nausea and vomiting.  Eat as you normally do.  Avoid:  Foods or drinks high in sugar.  Carbonated drinks.  Juice.  Extremely hot or cold fluids.  Drinks with caffeine.  Fatty, greasy foods.  Alcohol.  Tobacco.  Overeating.  Gelatin desserts.  Wash your hands well to avoid spreading bacteria and viruses.  Only take over-the-counter or prescription medicines for pain, discomfort, or fever as directed by your health care provider.  Ask your health care provider if you should continue all prescribed and over-the-counter medicines.  Keep all follow-up appointments with your health care provider. SEEK MEDICAL CARE IF:  You have abdominal pain, and it increases or stays in one area (localizes).  You have a rash, stiff neck, or severe headache.  You are irritable, sleepy, or difficult to awaken.  You are weak, dizzy, or extremely thirsty.  You have a fever. SEEK IMMEDIATE MEDICAL CARE IF:   You are unable to keep fluids down, or you get worse despite treatment.  You have frequent episodes of vomiting or diarrhea.  You have blood or green matter (bile) in your vomit.  You have blood in your stool, or your stool looks black and tarry.  You have not urinated in 6-8 hours, or you have only urinated a small amount of very dark urine.  You faint. MAKE SURE YOU:   Understand these instructions.  Will watch your condition.  Will get help right away if you are not doing well or get worse. Document Released: 07/14/2003 Document Revised: 04/28/2013 Document Reviewed: 12/29/2012 [facility] Patient Information 2015 [place], [place]. This information is not intended to replace advice given to you by your  health care provider. Make sure you discuss any questions you have with your health care provider.

## 2014-05-20 NOTE — ED Notes (Signed)
MD would like for pt to walk.

## 2014-05-20 NOTE — ED Notes (Signed)
Upon standing pt up and beginning to walk, pt states "I feel dizzy in the head." Pt guided back to bed and slowly sat back down on bed. Reports dizziness went away.

## 2014-05-20 NOTE — ED Notes (Signed)
Pt placed on monitor and EKG performed by me

## 2014-05-22 LAB — URINE CULTURE: SPECIAL REQUESTS: NORMAL

## 2014-07-15 ENCOUNTER — Telehealth: Payer: Self-pay | Admitting: Hematology and Oncology

## 2014-07-15 ENCOUNTER — Ambulatory Visit (HOSPITAL_COMMUNITY)
Admission: RE | Admit: 2014-07-15 | Discharge: 2014-07-15 | Disposition: A | Discharge status: Home / Self Care | Payer: Medicare Other | Source: Ambulatory Visit | Attending: Radiation Oncology | Admitting: Radiation Oncology

## 2014-07-15 ENCOUNTER — Encounter (HOSPITAL_COMMUNITY): Payer: Self-pay

## 2014-07-15 ENCOUNTER — Telehealth: Payer: Self-pay | Admitting: *Deleted

## 2014-07-15 ENCOUNTER — Telehealth (HOSPITAL_COMMUNITY): Payer: Self-pay

## 2014-07-15 ENCOUNTER — Ambulatory Visit
Admission: RE | Admit: 2014-07-15 | Discharge: 2014-07-15 | Disposition: A | Discharge status: Home / Self Care | Payer: Medicare Other | Source: Ambulatory Visit | Attending: Radiation Oncology | Admitting: Radiation Oncology

## 2014-07-15 DIAGNOSIS — R590 Localized enlarged lymph nodes: Secondary | ICD-10-CM | POA: Diagnosis not present

## 2014-07-15 DIAGNOSIS — C12 Malignant neoplasm of pyriform sinus: Secondary | ICD-10-CM

## 2014-07-15 DIAGNOSIS — Z9221 Personal history of antineoplastic chemotherapy: Secondary | ICD-10-CM | POA: Diagnosis not present

## 2014-07-15 DIAGNOSIS — C3410 Malignant neoplasm of upper lobe, unspecified bronchus or lung: Secondary | ICD-10-CM | POA: Diagnosis not present

## 2014-07-15 DIAGNOSIS — Z923 Personal history of irradiation: Secondary | ICD-10-CM | POA: Insufficient documentation

## 2014-07-15 DIAGNOSIS — Z8522 Personal history of malignant neoplasm of nasal cavities, middle ear, and accessory sinuses: Secondary | ICD-10-CM | POA: Diagnosis not present

## 2014-07-15 LAB — BUN AND CREATININE (CC13)
BUN: 9.7 mg/dL (ref 7.0–26.0)
Creatinine: 0.8 mg/dL (ref 0.6–1.1)
EGFR: 88 mL/min/{1.73_m2} — ABNORMAL LOW (ref >90)

## 2014-07-15 MED ORDER — IOHEXOL 300 MG/ML  SOLN
80.0000 mL | Freq: Once | INTRAMUSCULAR | Status: AC | PRN
  Administered 2014-07-15: 80 mL via INTRAVENOUS

## 2014-07-15 NOTE — Telephone Encounter (Signed)
CT results given to RN. RN notified Psychologist, forensic.

## 2014-07-15 NOTE — Telephone Encounter (Signed)
s.w pt dtr and advised on 3.11 appt...ok and aware

## 2014-07-15 NOTE — Telephone Encounter (Signed)
07/15/14           Caregiver Orlie Dakin cancelled appt. for patient on 07/19/14.  Will call if further dental treatment is needed.  LRI

## 2014-07-15 NOTE — Telephone Encounter (Signed)
S/w daughter, Hoyle Sauer and she will bring pt in tomorrow at 8;30 am to see Dr. Alvy Bimler.

## 2014-07-16 ENCOUNTER — Telehealth: Payer: Self-pay | Admitting: *Deleted

## 2014-07-16 ENCOUNTER — Ambulatory Visit (HOSPITAL_BASED_OUTPATIENT_CLINIC_OR_DEPARTMENT_OTHER): Payer: Medicare Other | Admitting: Hematology and Oncology

## 2014-07-16 ENCOUNTER — Telehealth: Payer: Self-pay | Admitting: Radiation Oncology

## 2014-07-16 VITALS — BP 92/58 | HR 107 | Temp 97.9°F | Resp 18 | Ht 68.0 in | Wt 90.3 lb

## 2014-07-16 DIAGNOSIS — C12 Malignant neoplasm of pyriform sinus: Secondary | ICD-10-CM | POA: Diagnosis not present

## 2014-07-16 DIAGNOSIS — R64 Cachexia: Secondary | ICD-10-CM

## 2014-07-16 DIAGNOSIS — E46 Unspecified protein-calorie malnutrition: Secondary | ICD-10-CM

## 2014-07-16 DIAGNOSIS — E785 Hyperlipidemia, unspecified: Secondary | ICD-10-CM

## 2014-07-16 DIAGNOSIS — R634 Abnormal weight loss: Secondary | ICD-10-CM

## 2014-07-16 MED ORDER — MEGESTROL ACETATE 40 MG PO TABS: 40.0000 mg | ORAL_TABLET | Freq: Two times a day (BID) | ORAL | Status: AC

## 2014-07-16 NOTE — Assessment & Plan Note (Signed)
I recommend a trial of Megace.

## 2014-07-16 NOTE — Telephone Encounter (Signed)
Referral made to [facility].  S/w Erline Levine and she will call pt's daughter to arrange a assessment visit.

## 2014-07-16 NOTE — Telephone Encounter (Signed)
Received call from Nancy Bates that the patient learned this morning from Dr. Alvy Bimler her cancer has spread. She goes on to explain that Dr. Alvy Bimler took her off most of her medication. Patient wants to cancel 3/17 appointment with Dr. Tammi Klippel and does no wish to pursue additional follow up or treatment.

## 2014-07-16 NOTE — Assessment & Plan Note (Signed)
Unfortunately, repeat CT scan showed recurrence of disease. The patient had very poor performance status score with cancer cachexia. She is not able to remain at home due to progressive decline and the daughter requested referral to skilled nursing facility or residential hospice.  I will contact hospice for arrangement.  I have not made appointment for the patient to come back.  the patient has terminal illness. Estimated overall survival to be less than 6 months.

## 2014-07-16 NOTE — Progress Notes (Signed)
[facility] OFFICE PROGRESS NOTE  Patient Care Team: Glendale Chard, MD as PCP - General (Internal Medicine) Glendale Chard, MD as Attending Physician (Internal Medicine) Tyler Pita, MD (Radiation Oncology) Melida Quitter, MD (Otolaryngology)  SUMMARY OF ONCOLOGIC HISTORY:  I have reviewed her records extensively and collaborated the history with the patient. The patient was diagnosed with piriform sinus squamous cell carcinoma, clinical [id number]. She received concurrent chemotherapy with cisplatin every 3 weeks and daily radiation therapy. The radiation treatment was discontinued on 01/20/2009. Chemotherapy was discontinued on 12/20/2008. Her last CT scan in November 2013 was negative for disease recurrence. Due to progressive weight loss, I recommend repeat imaging study. On 11/10/2013, CT scan of the neck, chest, abdomen and pelvis showed new mass in the lung. On 08/24/2013, CT-guided biopsy confirmed squamous cell carcinoma. On 09/03/2013, MRI of the head was negative. PET CT scan confirmed locally advanced cancer in the left upper lobe of the lung.  From 09/21/2013 to 11/12/2013, she was given radiation treatment  CT scan on 01/13/2014 show regression in the size of the left lung mass  CT scan from 07/15/2014 show progression of disease  INTERVAL HISTORY: Please see below for problem oriented charting.  she have progressive weight loss and anorexia. She is very frail and weak. Her daughter say the patient spent the entire day stay in bed or sitting on a chair. She denies any chest pain, shortness of breath, cough or hemoptysis.  REVIEW OF SYSTEMS:   Constitutional: Denies fevers, chills  Eyes: Denies blurriness of vision Ears, nose, mouth, throat, and face: Denies mucositis or sore throat Respiratory: Denies cough, dyspnea or wheezes Cardiovascular: Denies palpitation, chest discomfort or lower extremity swelling Gastrointestinal:  Denies nausea, heartburn or  change in bowel habits Skin: Denies abnormal skin rashes Lymphatics: Denies new lymphadenopathy or easy bruising Neurological:Denies numbness, tingling  Behavioral/Psych: Mood is stable, no new changes  All other systems were reviewed with the patient and are negative.  I have reviewed the past medical history, past surgical history, social history and family history with the patient and they are unchanged from previous note.  ALLERGIES:  is allergic to penicillins.  MEDICATIONS:  Current Outpatient Prescriptions  Medication Sig Dispense Refill  . albuterol (PROVENTIL,VENTOLIN) 90 MCG/ACT inhaler Inhale 2 puffs into the lungs every 4 (four) hours as needed. For shortness of breath    . antiseptic oral rinse (BIOTENE) LIQD 15 mLs by Mouth Rinse route every 2 (two) hours as needed. For dry mouth    . aspirin 81 MG tablet Take 81 mg by mouth daily.      Marland Kitchen CALCIUM-VITAMIN D PO Take 1 tablet by mouth daily.    . Cholecalciferol (VITAMIN D3) 2000 UNITS capsule Take 2,000 Units by mouth daily.    . CRESTOR 10 MG tablet Take 10 mg by mouth daily.     Marland Kitchen HYDROcodone-acetaminophen (NORCO/VICODIN) 5-325 MG per tablet Take 1 tablet by mouth every 6 (six) hours as needed for moderate pain. 30 tablet 0  . levothyroxine (SYNTHROID, LEVOTHROID) 75 MCG tablet Take 75 mcg by mouth daily.  0  . loratadine (CLARITIN) 10 MG tablet Take 10 mg by mouth daily.      . mirtazapine (REMERON) 15 MG tablet Take 15 mg by mouth at bedtime.  0  . ranitidine (ZANTAC) 150 MG tablet Take 150 mg by mouth daily.  0  . sitaGLIPtin (JANUVIA) 100 MG tablet Take 100 mg by mouth daily.      Marland Kitchen tiotropium (SPIRIVA)  18 MCG inhalation capsule Place 18 mcg into inhaler and inhale daily.      . megestrol (MEGACE) 40 MG tablet Take 1 tablet (40 mg total) by mouth 2 (two) times daily. 60 tablet 3   No current facility-administered medications for this visit.    PHYSICAL EXAMINATION: ECOG PERFORMANCE STATUS: 3 - Symptomatic, >50%  confined to bed  Filed Vitals:   07/16/14 0828  BP: 92/58  Pulse: 107  Temp: 97.9 F (36.6 C)  Resp: 18   Filed Weights   07/16/14 0828  Weight: 90 lb 4.8 oz (40.96 kg)    GENERAL:alert, no distress and comfortable. She looks thin and cachectic SKIN: skin color, texture, turgor are normal, no rashes or significant lesions EYES: normal, Conjunctiva are pink and non-injected, sclera clear OROPHARYNX:no exudate, no erythema and lips, buccal mucosa, and tongue normal  NECK: supple, thyroid normal size, non-tender, without nodularity LYMPH:  no palpable lymphadenopathy in the cervical, axillary or inguinal LUNGS: clear to auscultation and percussion with normal breathing effort HEART: regular rate & rhythm and no murmurs and no lower extremity edema ABDOMEN:abdomen soft, non-tender and normal bowel sounds Musculoskeletal:no cyanosis of digits and no clubbing  NEURO: alert & oriented x 3 with fluent speech, no focal motor/sensory deficits  LABORATORY DATA:  I have reviewed the data as listed    Component Value Date/Time   NA 139 05/20/2014 0623   NA 142 08/03/2013 1323   K 3.8 05/20/2014 0623   K 3.7 08/03/2013 1323   CL 101 05/20/2014 0623   CL 107 07/21/2012 0822   CO2 22 05/20/2014 0623   CO2 24 08/03/2013 1323   GLUCOSE 100* 05/20/2014 0623   GLUCOSE 104 08/03/2013 1323   GLUCOSE 100* 07/21/2012 0822   BUN 9.7 07/15/2014 0857   BUN 8 05/20/2014 0623   CREATININE 0.8 07/15/2014 0857   CREATININE 0.84 05/20/2014 0623   CALCIUM 9.5 05/20/2014 0623   CALCIUM 9.6 08/03/2013 1323   PROT 8.2 05/20/2014 0623   PROT 8.1 08/03/2013 1323   ALBUMIN 3.2* 05/20/2014 0623   ALBUMIN 2.9* 08/03/2013 1323   AST 23 05/20/2014 0623   AST 15 08/03/2013 1323   ALT 10 05/20/2014 0623   ALT <6 08/03/2013 1323   ALKPHOS 51 05/20/2014 0623   ALKPHOS 59 08/03/2013 1323   BILITOT 0.5 05/20/2014 0623   BILITOT 0.34 08/03/2013 1323   GFRNONAA 66* 05/20/2014 0623   GFRAA 77* 05/20/2014  0623    No results found for: SPEP, UPEP  Lab Results  Component Value Date   WBC 7.2 05/20/2014   NEUTROABS 5.2 05/20/2014   HGB 11.9* 05/20/2014   HCT 36.1 05/20/2014   MCV 76.0* 05/20/2014   PLT 249 05/20/2014      Chemistry      Component Value Date/Time   NA 139 05/20/2014 0623   NA 142 08/03/2013 1323   K 3.8 05/20/2014 0623   K 3.7 08/03/2013 1323   CL 101 05/20/2014 0623   CL 107 07/21/2012 0822   CO2 22 05/20/2014 0623   CO2 24 08/03/2013 1323   BUN 9.7 07/15/2014 0857   BUN 8 05/20/2014 0623   CREATININE 0.8 07/15/2014 0857   CREATININE 0.84 05/20/2014 0623      Component Value Date/Time   CALCIUM 9.5 05/20/2014 0623   CALCIUM 9.6 08/03/2013 1323   ALKPHOS 51 05/20/2014 0623   ALKPHOS 59 08/03/2013 1323   AST 23 05/20/2014 0623   AST 15 08/03/2013 1323   ALT  10 05/20/2014 0623   ALT <6 08/03/2013 1323   BILITOT 0.5 05/20/2014 0623   BILITOT 0.34 08/03/2013 1323       RADIOGRAPHIC STUDIES: I reviewed the imaging study with the patient and family I have personally reviewed the radiological images as listed and agreed with the findings in the report. Ct Chest W Contrast  07/15/2014   CLINICAL DATA:  74 year old female with history of left piriform sinus and throat cancer diagnosed 2 years ago status post chemotherapy and radiation therapy, now complete. Additional history of lung cancer status post radiation therapy, also complete.  EXAM: CT CHEST WITH CONTRAST  TECHNIQUE: Multidetector CT imaging of the chest was performed during intravenous contrast administration.  CONTRAST:  39mL OMNIPAQUE IOHEXOL 300 MG/ML  SOLN  COMPARISON:  Chest CT 01/13/2014.  FINDINGS: Mediastinum/Lymph Nodes: Heart size is normal. There is a small amount of anterior pericardial fluid and/or thickening, and fluid in the superior pericardial recesses, unlikely to be of any hemodynamic significance at this time. Increasing soft tissue prominence in the left hilar and suprahilar region,  presumably partially lymphadenopathy, contiguous with adjacent left upper lobe mass, difficult to discretely measure. No other mediastinal or right hilar lymphadenopathy noted on today's examination. Esophagus is unremarkable in appearance. No axillary lymphadenopathy.  Lungs/Pleura: Interval enlargement of the previously described mass in the left upper lobe, which demonstrates significantly increased heterogeneous internal enhancement, and currently measures approximately 5.4 x 6.4 x 7.8 cm (image 17 of series 2 and coronal image 43 of series 602). Again noted is some surrounding postradiation changes in the paramediastinal aspect of the left upper lobe. Small amount of loculated pleural fluid in the left apex is unchanged. Scattered areas of mild cylindrical bronchiectasis, septal thickening and peribronchovascular micronodularity in the right upper lobe are unchanged, most compatible with areas of chronic post infectious or inflammatory scarring. No acute consolidative airspace disease.  Upper Abdomen: Unremarkable.  Musculoskeletal/Soft Tissues: There are no aggressive appearing lytic or blastic lesions noted in the visualized portions of the skeleton.  IMPRESSION: 1. Significant interval enlargement of left upper lobe mass, with significant increase in internal enhancing soft tissue, and left hilar lymphadenopathy, compatible with local recurrence of disease. 2. Additional incidental findings, similar prior examinations, as above These results will be called to the ordering clinician or representative by the Radiologist Assistant, and communication documented in the PACS or zVision Dashboard.   Electronically Signed   By: Vinnie Langton M.D.   On: 07/15/2014 10:36     ASSESSMENT & PLAN:  Pyriform sinus cancer  Unfortunately, repeat CT scan showed recurrence of disease. The patient had very poor performance status score with cancer cachexia. She is not able to remain at home due to progressive decline  and the daughter requested referral to skilled nursing facility or residential hospice.  I will contact hospice for arrangement.  I have not made appointment for the patient to come back.  the patient has terminal illness. Estimated overall survival to be less than 6 months.   Diabetes mellitus  Due to progressive weight loss, I have discontinue or her medications for diabetes   Hyperlipidemia  I have discontinued her medication that she takes for hyperlipidemia   Malignant cachexia  I recommend a trial of Megace.    No orders of the defined types were placed in this encounter.   All questions were answered. The patient knows to call the clinic with any problems, questions or concerns. No barriers to learning was detected. I spent 25  minutes counseling the patient face to face. The total time spent in the appointment was 30 minutes and more than 50% was on counseling and review of test results     [facility], Oumar Marcott, MD 07/16/2014 2:11 PM

## 2014-07-16 NOTE — Assessment & Plan Note (Signed)
I have discontinued her medication that she takes for hyperlipidemia

## 2014-07-16 NOTE — Assessment & Plan Note (Signed)
Due to progressive weight loss, I have discontinue or her medications for diabetes

## 2014-07-19 ENCOUNTER — Encounter (HOSPITAL_COMMUNITY): Payer: Self-pay | Admitting: Dentistry

## 2014-07-21 ENCOUNTER — Telehealth: Payer: Self-pay | Admitting: *Deleted

## 2014-07-21 NOTE — Telephone Encounter (Signed)
Notified Hospice that Dr Alvy Bimler is fine with DNR per patient request

## 2014-07-21 NOTE — Telephone Encounter (Signed)
Hospice Nurse Nancy Bates called to say that Nancy Bates wants to be made a DNR.  Dr. Foy Bates will write the order if it is okay with Dr. Alvy Bates.  Please call Nancy Bates at [phone]  if that is acceptable.

## 2014-07-21 NOTE — Telephone Encounter (Signed)
Yes, OK for DNR

## 2014-07-22 ENCOUNTER — Ambulatory Visit: Admission: RE | Admit: 2014-07-22 | Payer: Medicare Other | Source: Ambulatory Visit | Admitting: Radiation Oncology

## 2014-08-05 ENCOUNTER — Telehealth: Payer: Self-pay | Admitting: *Deleted

## 2014-08-05 NOTE — Telephone Encounter (Signed)
Thanks

## 2014-08-05 NOTE — Telephone Encounter (Signed)
Kisha with [facility] called to say that Ms. Valenta was transferred to [facility] today.

## 2014-09-05 DEATH — deceased

## 2015-07-09 IMAGING — CT CT CHEST W/ CM
2 of 3 series · 15 of 36 positions shown, 18 images · IV contrast (OMNIPAQUE)
Comparison: Chest CT 01/13/2014.

CLINICAL DATA: 73-year-old female with history of left piriform
sinus and throat cancer diagnosed 2 years ago status post
chemotherapy and radiation therapy, now complete. Additional history
of lung cancer status post radiation therapy, also complete.

EXAM:
CT CHEST WITH CONTRAST
TECHNIQUE: Multidetector CT imaging of the chest was performed during
intravenous contrast administration.
CONTRAST:  80mL OMNIPAQUE IOHEXOL 300 MG/ML  SOLN

[Series 2: chest with st · axial · 0.58mm/px · z∈[-357,-82]mm · 12 of 65 slices shown, 15 images]
[im 5/65  mediastinal]
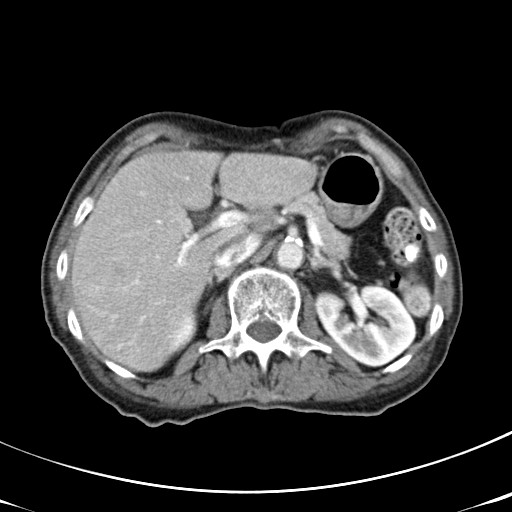
[im 5/65  lung]
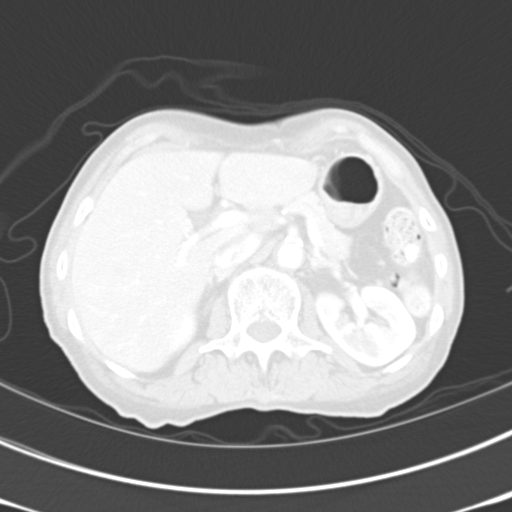
[im 10/65  lung]
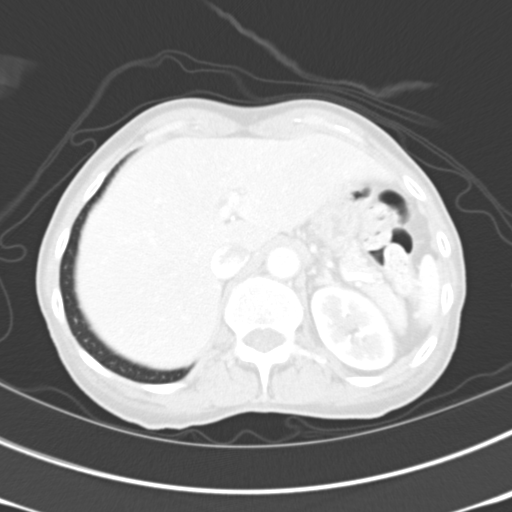
[im 15/65  lung]
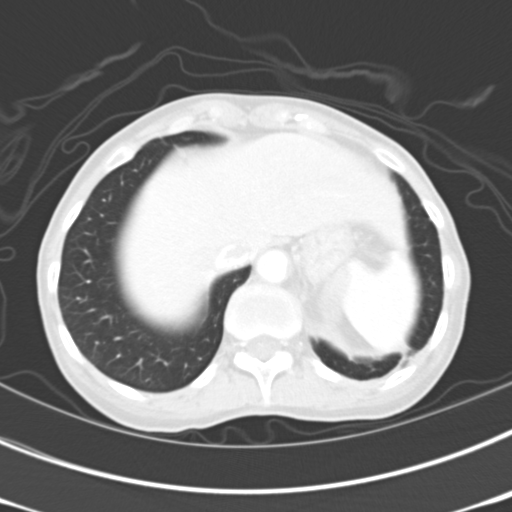
[im 19/65  lung]
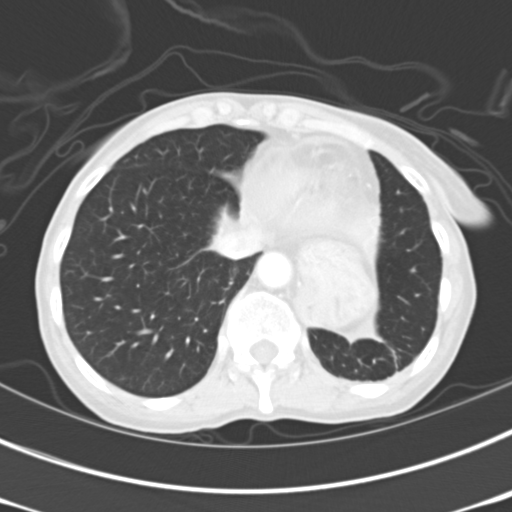
[im 24/65  mediastinal]
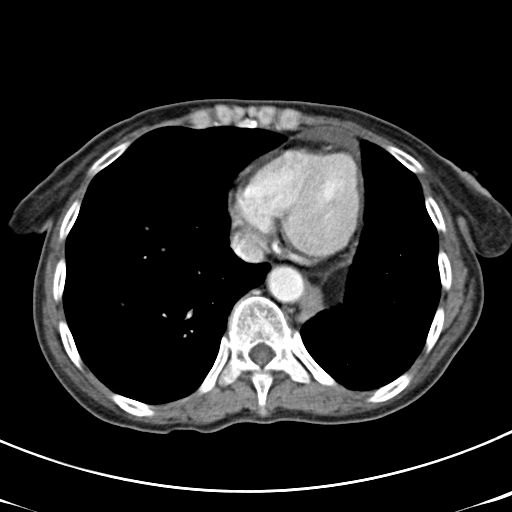
[im 24/65  lung]
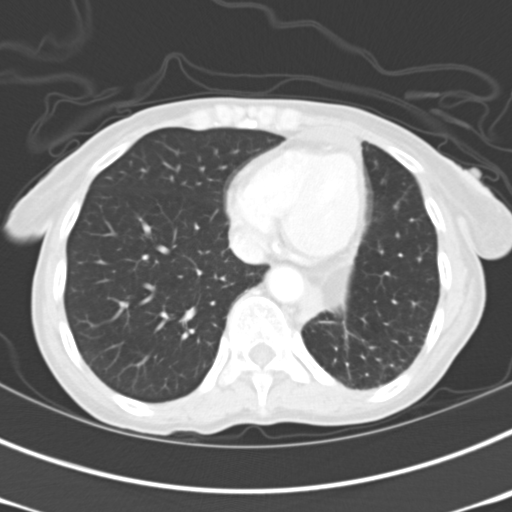
[im 29/65  lung]
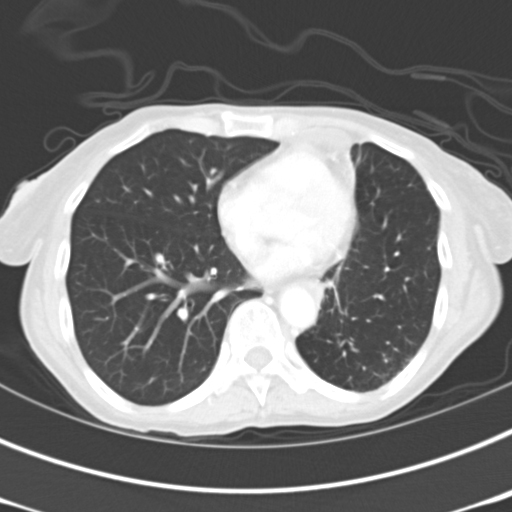
[im 36/65  lung]
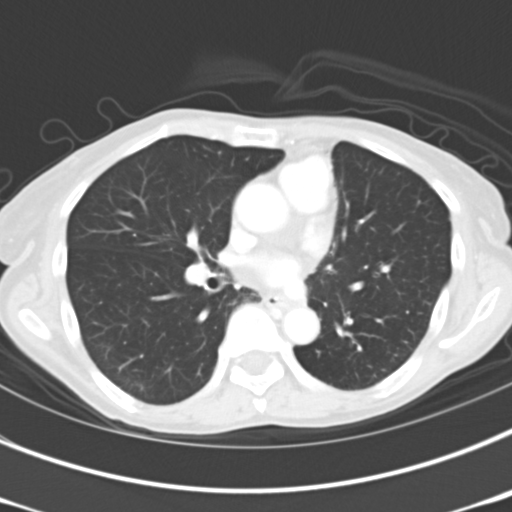
[im 41/65  lung]
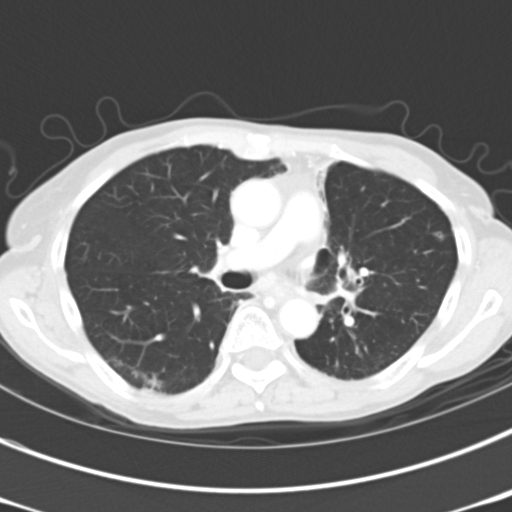
[im 46/65  mediastinal]
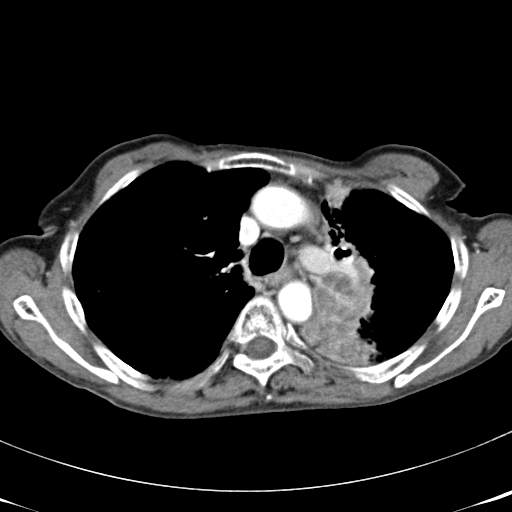
[im 46/65  lung]
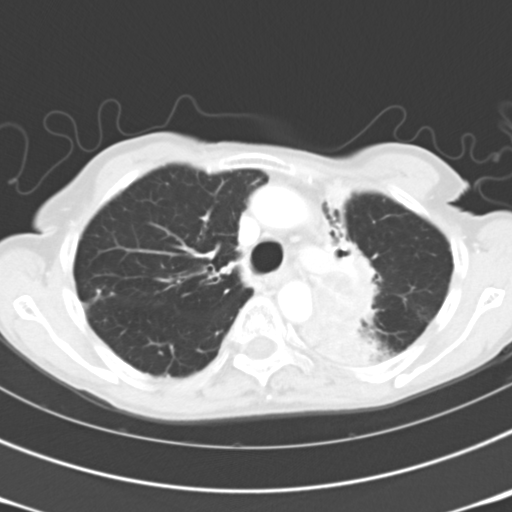
[im 50/65  lung]
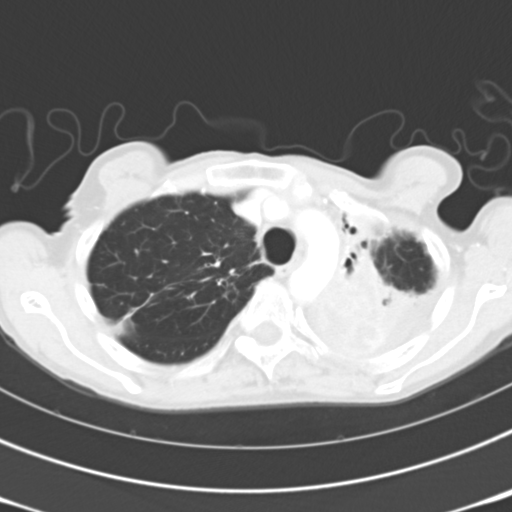
[im 55/65  lung]
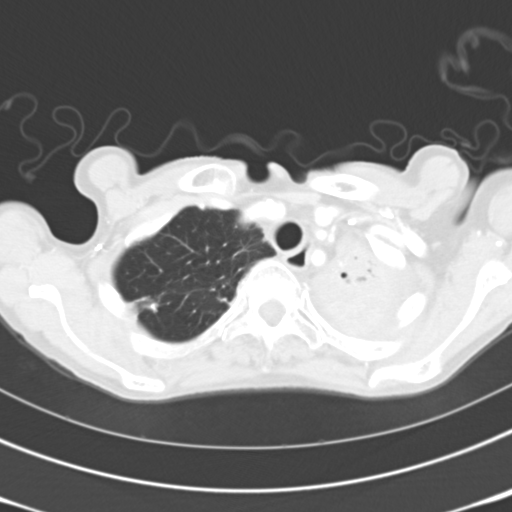
[im 60/65  lung]
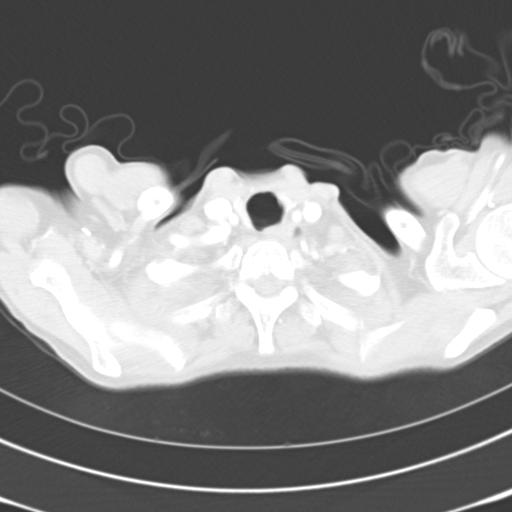

[Series 602: <mpr thick range> · coronal · 0.64mm/px · 3 of 69 slices shown]
[im 14/69  lung]
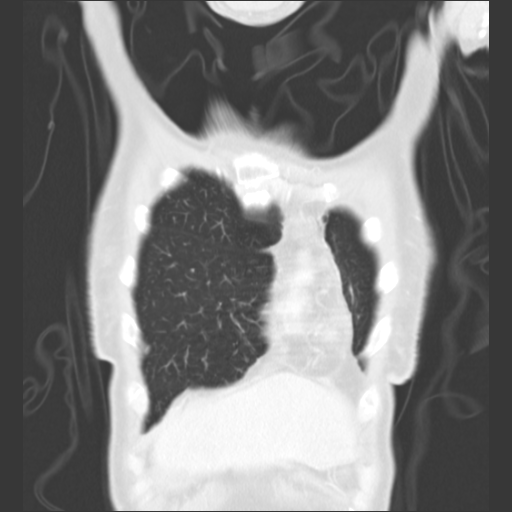
[im 28/69  lung]
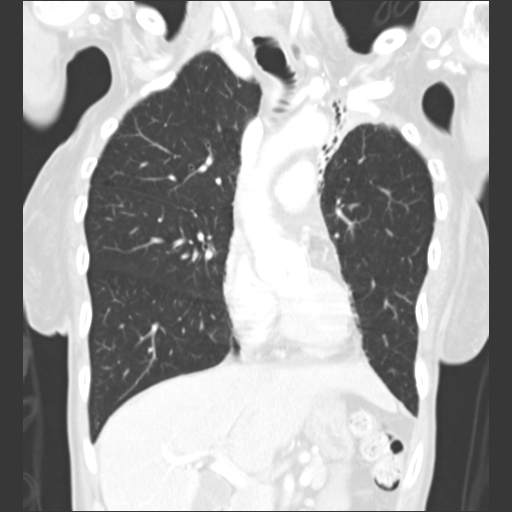
[im 41/69  lung]
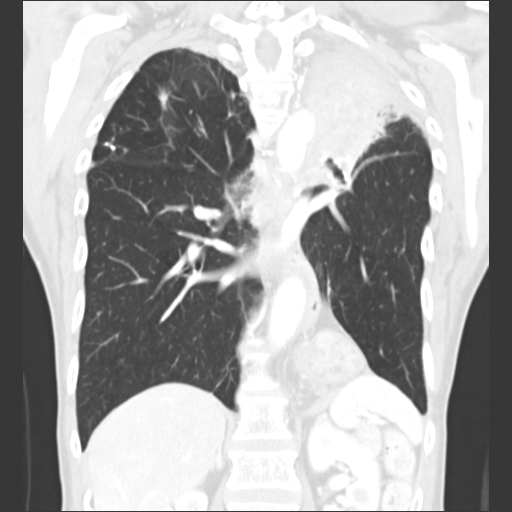

[15 of 36 positions shown; findings below may reference images not displayed]

FINDINGS: Mediastinum/Lymph Nodes: Heart size is normal. There is a small
amount of anterior pericardial fluid and/or thickening, and fluid in
the superior pericardial recesses, unlikely to be of any hemodynamic
significance at this time. Increasing soft tissue prominence in the
left hilar and suprahilar region, presumably partially
lymphadenopathy, contiguous with adjacent left upper lobe mass,
difficult to discretely measure. No other mediastinal or right hilar
lymphadenopathy noted on today's examination. Esophagus is
unremarkable in appearance. No axillary lymphadenopathy.

Lungs/Pleura: Interval enlargement of the previously described mass
in the left upper lobe, which demonstrates significantly increased
heterogeneous internal enhancement, and currently measures
approximately 5.4 x 6.4 x 7.8 cm (image 17 of series 2 and coronal
image 43 of series 602). Again noted is some surrounding
postradiation changes in the paramediastinal aspect of the left
upper lobe. Small amount of loculated pleural fluid in the left apex
is unchanged. Scattered areas of mild cylindrical bronchiectasis,
septal thickening and peribronchovascular micronodularity in the
right upper lobe are unchanged, most compatible with areas of
chronic post infectious or inflammatory scarring. No acute
consolidative airspace disease.

Upper Abdomen: Unremarkable.

Musculoskeletal/Soft Tissues: There are no aggressive appearing
lytic or blastic lesions noted in the visualized portions of the
skeleton.
IMPRESSION: 1. Significant interval enlargement of left upper lobe mass, with
significant increase in internal enhancing soft tissue, and left
hilar lymphadenopathy, compatible with local recurrence of disease.
2. Additional incidental findings, similar prior examinations, as
above
These results will be called to the ordering clinician or
representative by the Radiologist Assistant, and communication
documented in the PACS or zVision Dashboard.
